# Patient Record
Sex: Female | Born: 1995 | Hispanic: Yes | Marital: Single | Smoking: Never smoker
Health system: Southern US, Community
[De-identification: ages and names within clinical notes are randomized; demographics above are authoritative.]

---

## 2021-02-16 ENCOUNTER — Ambulatory Visit (INDEPENDENT_AMBULATORY_CARE_PROVIDER_SITE_OTHER): Payer: Self-pay | Admitting: Primary Care

## 2021-02-16 ENCOUNTER — Encounter (INDEPENDENT_AMBULATORY_CARE_PROVIDER_SITE_OTHER): Payer: Self-pay | Admitting: Primary Care

## 2021-02-16 ENCOUNTER — Other Ambulatory Visit: Payer: Self-pay

## 2021-02-16 VITALS — BP 117/75 | HR 69 | Temp 97.5°F | Ht 62.0 in | Wt 220.0 lb

## 2021-02-16 DIAGNOSIS — R051 Acute cough: Secondary | ICD-10-CM

## 2021-02-16 DIAGNOSIS — Z131 Encounter for screening for diabetes mellitus: Secondary | ICD-10-CM

## 2021-02-16 DIAGNOSIS — Z7689 Persons encountering health services in other specified circumstances: Secondary | ICD-10-CM

## 2021-02-16 DIAGNOSIS — R7303 Prediabetes: Secondary | ICD-10-CM

## 2021-02-16 DIAGNOSIS — Z6841 Body Mass Index (BMI) 40.0 and over, adult: Secondary | ICD-10-CM

## 2021-02-16 LAB — POCT GLYCOSYLATED HEMOGLOBIN (HGB A1C): Hemoglobin A1C: 5.7 % — AB (ref 4.0–5.6)

## 2021-02-16 NOTE — Patient Instructions (Addendum)
Gripe en los adultos Influenza, Adult A la gripe tambin se la conoce como influenza. Es una [employer], la nariz y la garganta (vas respiratorias). Se transmite fcilmente de persona a persona (es contagiosa). La gripe causa sntomas que son [employer] de un resfro, junto con fiebre alta y dolores corporales. Cules son las causas? La causa de esta afeccin es el virus de la influenza. Puede contraer el virus de las siguientes maneras: Al inhalar gotitas que quedan en el aire despus de que una persona infectada con gripe tosi o estornud. Al tocar algo que est contaminado con el virus y [employer] mano a la boca, la nariz o los ojos. Qu incrementa el riesgo? Hay ciertas cosas que lo pueden hacer ms propenso a Nurse, adult. Estas incluyen lo siguiente: No lavarse las manos con frecuencia. Tener contacto cercano con [employer] durante la temporada de resfro y gripe. Tocarse la boca, los ojos o la nariz sin antes lavarse las manos. No recibir la [employer] INC. Puede correr un mayor riesgo de [place] gripe, y [place] graves, como una infeccin pulmonar (neumona), si usted: Es mayor de 50 aos de edad. Est embarazada. Tiene debilitado el sistema que combate las defensas (sistema inmunitario) debido a una enfermedad o a que toma determinados medicamentos. Tiene una afeccin a largo plazo (crnica), como las siguientes: Enfermedad cardaca, renal o pulmonar. Diabetes. Asma. Tiene un trastorno heptico. Tiene mucho sobrepeso (obesidad [place]). Tiene anemia. Cules son los signos o sntomas? Los sntomas normalmente comienzan de repente y Sonda Primes [address] y [address]. Pueden incluir los siguientes: Cristy Hilts y Danielson. Dolores de [place], dolores en el cuerpo o dolores musculares. Dolor de Investment banker, operational. Tos. Secrecin o congestin nasal. Molestias en el pecho. No querer comer tanto como lo hace normalmente. Sensacin de debilidad o  cansancio. Mareos. Malestar estomacal o vmitos. Cmo se trata? Si la gripe se detecta de forma temprana, puede recibir tratamiento con medicamentos antivirales. Esto puede ayudar a reducir la gravedad y la duracin de la enfermedad. Se los administrarn por boca o a travs de un tubo (catter) intravenoso. Cuidarse en su hogar puede ayudar a que mejoren los sntomas. El mdico puede recomendarle lo siguiente: Tomar medicamentos de Radio broadcast assistant. Beber abundante lquido. La gripe suele desaparecer sola. Si tiene sntomas muy graves u otros problemas, puede recibir tratamiento en un hospital. Siga estas instrucciones en su casa:   Actividad Descanse todo lo que sea necesario. Duerma lo suficiente. Foy [place] en su casa y no concurra al [place] o a la escuela, como se lo haya indicado el mdico. No salga de su casa hasta que no haya tenido fiebre por 24 horas sin tomar medicamentos. Salga de su casa solamente para ir al [employer]. Comida y bebida Wyatt Haste SRO (solucin de rehidratacin oral). Es Ardelia Mems bebida que se vende en farmacias y tiendas. Beba suficiente lquido como para Theatre manager la orina de color amarillo plido. En la medida en que pueda, beba lquidos transparentes en pequeas cantidades. Los lquidos transparentes son, por ejemplo: Grayce Sessions. Trocitos de hielo. Jugo de frutas mezclado con agua. Bebidas deportivas de bajas caloras. Coma alimentos suaves que sean fciles de digerir. En la medida que pueda, consuma pequeas cantidades. Estos alimentos incluyen: Bananas. Pur de [employer]. Arroz. Carnes magras. Tostadas. Galletas. No coma ni beba lo siguiente: Lquidos con alto contenido de azcar o cafena. Alcohol. Alimentos condimentados o con alto contenido de [place]. Indicaciones generales Use los medicamentos de venta libre y los recetados solamente  como se lo haya indicado el médico. °Use un humidificador de aire frío para que el aire de su casa esté más húmedo. Esto puede facilitar  la respiración. °Cuando utilice un humidificador de vapor frío, límpielo a diario. Vacíe el agua y cámbiela por agua limpia. °Al toser o estornudar, cúbrase la boca y la nariz. °Lávese las manos frecuentemente con agua y jabón y durante al menos 20 segundos. Esto también es importante después de toser o estornudar. Si no dispone de agua y jabón, use desinfectante para manos con alcohol. °Cumpla con todas las visitas de seguimiento. °¿Cómo se previene? ° °Colóquese la vacuna antigripal todos los años. Puede colocarse la vacuna contra la gripe a fines de verano, en otoño o en invierno. Pregúntele al médico cuándo debe aplicarse la vacuna contra la gripe. °Evite el contacto con personas que estén enfermas durante el otoño y el invierno. Es la temporada del resfrío y la gripe. °Comuníquese con un médico si: °Tiene síntomas nuevos. °Tiene los siguientes síntomas: °Dolor de pecho. °Materia fecal líquida (diarrea). °Fiebre. °La tos empeora. °Empieza a tener más mucosidad. °Tiene malestar estomacal. °Vomita. °Solicite ayuda de inmediato si: °Le falta el aire. °Tiene dificultad para respirar. °La piel o las uñas se ponen de un color azulado. °Presenta dolor muy intenso o rigidez en el cuello. °Tiene dolor de cabeza repentino. °Le duele la cara o el oído de forma repentina. °No puede comer ni beber sin vomitar. °Estos síntomas pueden representar un problema grave que constituye una emergencia. Solicite atención médica de inmediato. Comuníquese con el servicio de emergencias de su localidad (911 en los [place]). °No espere a ver si los síntomas desaparecen. °No conduzca por sus propios medios hasta el hospital. °Resumen °A la gripe también se la conoce como “influenza”. Es una infección en los pulmones, la nariz y la garganta. Se transmite fácilmente de una persona a otra. °Use los medicamentos de venta libre y los recetados solamente como se lo haya indicado el médico. °Aplicarse la vacuna contra la gripe todos los  años es la mejor manera de no contagiarse la gripe. °Esta información no tiene como fin reemplazar el consejo del médico. Asegúrese de hacerle al médico cualquier pregunta que tenga. °Document Revised: 11/26/2019 Document Reviewed: 11/26/2019 °Elsevier Patient Education © 2022 Elsevier Inc. °Vacuna Tdap (tétanos, difteria y tos ferina): lo que debe saber °Tdap (Tetanus, Diphtheria, Pertussis) Vaccine: What You Need to Know °1. ¿Por qué vacunarse? °La vacuna Tdap puede prevenir el tétanos, la difteria y la tos ferina. °La difteria y la tos ferina se contagian de persona a persona. El tétanos ingresa al organismo a través de cortes o heridas. °El TÉTANOS (T) provoca rigidez dolorosa en los músculos. El tétanos puede causar graves problemas de salud, como no poder abrir la boca, tener dificultad para tragar y respirar, o la muerte. °La DIFTERIA (D) puede causar dificultad para respirar, insuficiencia cardíaca, parálisis o muerte. °La TOS FERINA (aP), también conocida como “tos convulsa”, puede causar tos violenta e incontrolable lo que hace difícil respirar, comer o beber. La tos ferina puede ser muy grave, especialmente en los bebés y en los niños pequeños, y causar neumonía, convulsiones, daño cerebral o la muerte. En adolescentes y adultos, puede causar pérdida de peso, pérdida del control de la vejiga, desmayos y fracturas de costillas al toser de manera intensa. °2. Vacuna Tdap °La vacuna Tdap es solo para niños de 7 años en adelante, adolescentes y adultos.  °Los adolescentes deben recibir una dosis única de la vacuna   Tdap, preferentemente a los 11 o 12 años. °Las personas embarazadas deben recibir una dosis de la vacuna Tdap durante cada embarazo, preferiblemente durante la primera parte del tercer trimestre, para ayudar a proteger al recién nacido de la tos ferina. Los bebés tienen mayor riesgo de sufrir complicaciones graves y potencialmente mortales debido a la tos ferina. °Los adultos que nunca recibieron la  vacuna Tdap deben recibir una dosis. °Además, los adultos deben recibir una dosis de refuerzo de Tdap o Td (una vacuna diferente que protege contra el tétanos y la difteria, pero no contra la tos ferina) cada 10 años, o después de 5 años en caso de herida o quemadura grave o sucia. °La vacuna Tdap puede ser administrada al mismo tiempo que otras vacunas. °3. Hable con el médico °Comuníquese con la persona que le coloca las vacunas si la persona que la recibe: °Ha tenido una reacción alérgica después de una dosis anterior de cualquier vacuna contra el tétanos, la difteria o la tos ferina, o cualquier alergia grave, potencialmente mortal °Ha tenido un coma, disminución del nivel de la conciencia o convulsiones prolongadas dentro de los 7 días posteriores a una dosis anterior de cualquier vacuna contra la tos ferina (DTP, DTaP o Tdap) °Tiene convulsiones u otro problema del sistema nervioso °Alguna vez tuvo síndrome de Guillain-Barré (también llamado “SGB”) °Ha tenido dolor intenso o hinchazón después de una dosis anterior de cualquier vacuna contra el tétanos o la difteria °En algunos casos, es posible que el médico decida posponer la aplicación de la vacuna Tdap para una visita en el futuro. °Las personas que sufren trastornos menores, como un resfrío, pueden vacunarse. Las personas que tienen enfermedades moderadas o graves generalmente deben esperar hasta recuperarse para poder recibir la vacuna Tdap.  °Su médico puede darle más información. °4. Riesgos de una reacción a la vacuna °Después de recibir la vacuna Tdap a veces se puede tener dolor, enrojecimiento o hinchazón en el lugar donde se aplicó la inyección, fiebre leve, dolor de cabeza, sensación de cansancio y náuseas, vómitos, diarrea o dolor de estómago. °Las personas a veces se desmayan después de procedimientos médicos, incluida la vacunación. Informe al médico si se siente mareado, tiene cambios en la visión o zumbidos en los oídos.  °Al igual que con  cualquier medicamento, existe una probabilidad muy remota de que una vacuna cause una reacción alérgica grave, otra lesión grave o la muerte. °5. ¿Qué pasa si se presenta un problema grave? °Podría producirse una reacción alérgica después de que la persona vacunada abandone la clínica. Si observa signos de una reacción alérgica grave (ronchas, hinchazón de la cara y la garganta, dificultad para respirar, latidos cardíacos acelerados, mareos o debilidad), llame al 9-1-1 y lleve a la persona al hospital más cercano. °Si se presentan otros signos que le preocupan, comuníquese con su médico.  °Las reacciones adversas deben informarse al Sistema de Informe de Eventos Adversos de Vacunas (Vaccine Adverse Event Reporting System, VAERS). Por lo general, el médico presenta este informe o puede hacerlo usted mismo. Visite el sitio web del VAERS en www.vaers.hhs.gov o llame al 1-800-822-7967. El VAERS es solo para informar reacciones, y los miembros de su personal no proporcionan asesoramiento médico. °6. Programa Nacional de Compensación de Daños por Vacunas °El Programa Nacional de Compensación de Daños por Vacunas (National Vaccine Injury Compensation Program, VICP) es un programa federal que fue creado para compensar a las personas que puedan haber sufrido daños al recibir ciertas vacunas. Las reclamaciones relativas a presuntas lesiones o   muerte debidas a la vacunacin tienen un lmite de tiempo para su presentacin, que puede ser de tan solo [employer]. Visite el sitio web del VICP en GoldCloset.com.ee o llame al [phone]) 749-7523 para obtener ms informacin acerca del programa y de cmo presentar un reclamo. 7. Cmo puedo obtener ms informacin? Pregntele a su mdico. Comunquese con el servicio de salud de su localidad o su estado. Visite el sitio web de Research scientist (life sciences), Surveyor, minerals) (Administracin de Alimentos y Chief Strategy Officer) para ver los prospectos de las vacunas e informacin adicional en  TraderRating.uy. Comunquese con Garment/textile technologist for [employer], CDC (Centros para el Control y la Prevencin de [place]): Llame al [phone] (1-800-CDC-INFO) o Visite el sitio web de [employer] en http://hunter.com/. Declaracin de informacin de la vacuna Tdap (ttanos, difteria y Elmer Picker) (09/18/2019) Esta informacin no tiene Marine scientist el consejo del mdico. Asegrese de hacerle al mdico cualquier pregunta que tenga. Document Revised: 11/17/2019 Document Reviewed: 11/02/2019 Elsevier Patient Education  2022 [place] la diabetes mellitus tipo 2 Preventing Type 2 Diabetes Mellitus La diabetes tipo 2, tambin llamada diabetes mellitus tipo 2, es una enfermedad a Barrister's clerk (crnica) que afecta los niveles de azcar (glucosa) en la [place]. Normalmente, una hormona denominada insulina permite el ingreso de la glucosa en las clulas del cuerpo. Las clulas usan la glucosa para Dealer. Las personas con diabetes tipo 2 pueden tener uno o ambos problemas descriptos a continuacin: El pncreas no produce la cantidad suficiente de insulina. Las clulas del cuerpo no responden de [place] a la insulina que el organismo produce (resistencia a la insulina). La resistencia a la insulina o la falta de insulina hacen que el exceso de glucosa se acumule en la sangre, en lugar de ir a las clulas. Como resultado, aumenta la glucemia (hiperglucemia). Esto puede causar muchas complicaciones. Al tener sobrepeso o ser obeso y tener un estilo de vida inactivo (sedentario) puede aumentar su riesgo de padecer diabetes. La diabetes tipo 2 se puede retrasar o prevenir al realizar determinados cambios en la alimentacin y el estilo de vida. Cmo puede afectarme esta enfermedad? Si no toma medidas para prevenir la diabetes, los niveles de glucemia pueden continuar aumentando con el [place]. Un exceso de glucosa en la  sangre durante mucho tiempo puede daar los vasos sanguneos, el corazn, los riones, los nervios y los ojos. La diabetes tipo 2 puede ocasionar problemas de salud crnicos y complicaciones, tales como: Enfermedad cardaca. Accidente cerebrovascular. Ceguera. Enfermedad renal. Depresin. Mala circulacin en los pies y las piernas. En los casos graves, puede ser necesario extirpar quirrgicamente (amputar) un pie o una pierna. [place]? Puede ser ms probable que desarrolle diabetes tipo 2 si: Tiene casos de diabetes tipo 2 en su familia. Tiene sobrepeso u obesidad. Tiene un estilo de vida sedentario. Tiene resistencia a la insulina o antecedentes de prediabetes. Tiene antecedentes de diabetes relacionada con el embarazo (gestacional) o sndrome del ovario poliqustico (SOP). Qu medidas de prevencin puedo tomar? Puede ser difcil reconocer los signos de la diabetes tipo 2. Tomar medidas para prevenir la enfermedad antes que desarrollar sntomas es la mejor manera de evitar posibles daos en el cuerpo. Realizar ciertos Catering manager alimentacin y el estilo de vida puede prevenir o Nurse, children's enfermedad y los problemas de salud relacionados. Nutricin  Consuma comidas y refrigerios saludables con regularidad. No omita comidas. Las frutas o un puado de frutos secos son Ardelia Mems colacin saludable  entre las comidas. Beba agua [place]. Evite los lquidos que contengan azcar agregada como las gaseosas o el t [place]. Beba suficiente lquido como para Theatre manager la orina de color amarillo plido. Siga las instrucciones del mdico respecto de las restricciones en las comidas o las bebidas. Limite la cantidad de alimentos que come: Controle la cantidad que come por vez (tamao de la porcin). Consulte las etiquetas de los alimentos para Civil engineer, contracting el tamao de la porcin correspondiente. Use una balanza de cocina para pesar las cantidades de alimentos. Saltee o hierva al  vapor los Building surveyor de frerlos. Cocine con agua o caldo en lugar de aceites o manteca. Limite las grasas saturadas y la sal (sodio) en su alimentacin. No consuma ms de 1 cucharadita (2,400 mg) de [employer]. Si tiene enfermedad cardaca o presin arterial alta, consuma menos de  a  cucharadita (1,500 mg) de [employer]. Estilo de vida  Baje de peso si es necesario, tal como se lo hayan indicado. El mdico puede determinar cunto peso tiene que perder y Hotel manager a que adelgace de Geographical information systems officer segura. Si tiene sobrepeso o es obeso, es posible que le digan que pierda al menos entre el 5 y el 7 % de su peso corporal. Encrguese de la presin arterial, el colesterol y el estrs. Su mdico lo ayudar a Leisure centre manager tratamiento para usted. No consuma ningn producto que contenga nicotina o tabaco. Estos productos incluyen cigarrillos, tabaco para Higher education careers adviser y aparatos de vapeo, como los Psychologist, sport and exercise. Si necesita ayuda para dejar de fumar, consulte al [employer]. Actividad  Practique actividad fsica que le acelere el ritmo cardaco y lo haga sudar (intensidad [place]). Haga [employer], al menos, 30 minutos, como mnimo 5 das por semana, o segn lo indicado por el mdico. Pregntele al mdico qu actividades son seguras para usted. Una combinacin de actividades puede ser la mejor opcin como, por ejemplo, caminar, Psychologist, prison and probation services natacin, andar en bicicleta y hacer entrenamiento de fuerza. Trate de agregar actividad fsica a su jornada. Por ejemplo: Estacione el automvil en lugares ms alejados de lo habitual para tener que caminar ms. Vaya a caminar durante su hora de almuerzo. Utilice las Clinical cytogeneticist de ascensores o escaleras mecnicas. Camine o vaya en bicicleta al Mat [place]ne en lugar de conducir. Consumo de alcohol Si bebe alcohol: Limite la cantidad que bebe a lo siguiente: De 0 a 1 medida por da para las mujeres que no estn embarazadas. De 0 a 2 medidas por da para  los hombres. Sepa cunta cantidad de alcohol hay en las bebidas que toma. En los [place], una medida equivale a una botella de cerveza de 12 oz (355 ml), un vaso de vino de 5 oz (148 ml) o un vaso de una bebida alcohlica de alta graduacin de 1 oz (44 ml). Informacin general Hable con el mdico acerca de todos los factores de riesgo y de cmo puede reducir el riesgo de desarrollar diabetes. Contrlese la glucemia de manera regular, segn lo indicado por su mdico. Somtase a pruebas de deteccin segn lo indicado por su mdico. Puede realizrselas con regularidad, especialmente si tiene ciertos factores de riesgo de padecer diabetes tipo 2. Programe una cita con un nutricionista matriculado. Este especialista en alimentacin y nutricin puede ayudarlo a crear un plan de alimentacin saludable y a comprender los tamaos de las porciones y las etiquetas de los alimentos. Dnde buscar apoyo Pida a su mdico que le recomiende un nutricionista matriculado, un  especialista en atencin y educacin sobre la diabetes certificado o un programa para Sports coach de [place]. Busque grupos de apoyo para bajar de peso a nivel local o en lnea. Asista a un gimnasio, club de acondicionamiento fsico o nase a un grupo que realice actividad fsica al aire libre como un club de caminata. Dnde buscar ms informacin Para obtener ayuda y Fish farm manager, as como ms informacin sobre la diabetes y la prevencin de la diabetes, visite: American Diabetes Association (ADA) (Asociacin Estadounidense de la Diabetes): www.diabetes.Unisys Corporation of Diabetes and Digestive and Kidney Diseases ([place] Diabetes y [place] y [place]): DesMoinesFuneral.dk Para obtener ms informacin sobre alimentacin saludable, visite: U.S. Deparment of Agriculture Scientist, research (physical sciences)) (Departamento de Agricultura de los EE. UU.): FormerBoss.no Office of Disease Prevention and Health Promotion ([place]) (Oficina  de Prevencin de [place] y Promocin de la Salud): [place]ud[place]aleEstates.be Resumen Puede prevenir la diabetes tipo 2 o Lexicographer su aparicin al comer alimentos saludables, bajar de peso si es necesario y aumentar su actividad fsica. Hable con el mdico acerca de sus factores de riesgo para desarrollar diabetes tipo 2 y de cmo puede reducir dicho riesgo. Puede ser difcil reconocer los signos de la diabetes tipo 2. La mejor manera de evitar posibles daos en el cuerpo es tomar medidas para prevenir la enfermedad antes que usted desarrolle los sntomas. Somtase a pruebas de Programme researcher, broadcasting/film/video segn lo indicado por su mdico. Esta informacin no tiene Marine scientist el consejo del mdico. Asegrese de hacerle al mdico cualquier pregunta que tenga. Document Revised: 05/19/2020 Document Reviewed: 05/19/2020 Elsevier Patient Education  2022 [place] de caloras para bajar de peso Calorie Counting for [employer] Loss Las caloras son unidades de Teacher, early years/pre. El cuerpo necesita una cierta cantidad de caloras de los alimentos para que lo ayuden a funcionar durante todo Games developer. Cuando se comen o beben ms caloras de las que el cuerpo [place], este acumula las caloras adicionales mayormente como grasa. Cuando se comen o beben menos caloras de las que el cuerpo [place], este quema grasa para obtener la energa que necesita. El recuento de caloras es el registro de la cantidad de caloras que se comen y English as a second language teacher. El recuento de caloras puede ser de ayuda si necesita perder peso. Si come menos caloras de las que el cuerpo necesita, debera bajar de [place]. Pregntele al mdico cul es un peso sano para usted. Para que el recuento de caloras funcione, usted tendr que ingerir la cantidad de caloras adecuadas cada da, para bajar una cantidad de peso saludable por semana. Un nutricionista puede ayudar a determinar la cantidad de caloras que usted necesita por da y sugerirle formas de Science writer su  objetivo calrico. Ardelia Mems cantidad de peso saludable para bajar cada semana suele ser entre 1 y 2 libras (0.5 a 0.9 kg). Esto habitualmente significa que su ingesta diaria de caloras se debera reducir en unas 500 a 750 caloras. Ingerir de 1200 a 1500 caloras por Administrator, Civil Service a la [employer] de las mujeres a Sports coach de [place]. Ingerir de 1500 a 1800 caloras por Administrator, Civil Service a la [employer] de los hombres a Sports coach de [place]. Qu debo saber acerca del recuento de caloras? Trabaje con el mdico o el nutricionista para determinar cuntas caloras debe recibir Armed forces operational officer. A fin de alcanzar su objetivo diario de caloras, tendr que: Averiguar cuntas caloras hay en cada alimento que le Therapist, occupational. Intente hacerlo antes de comer. Decidir la cantidad que puede comer del alimento.  Llevar un registro de los alimentos. Para esto, anote lo que comi y cuntas caloras tena. Para perder peso con xito, es importante equilibrar el recuento de caloras con un estilo de vida saludable que incluya actividad fsica de forma regular. Dnde encuentro informacin sobre las caloras? Es posible Animator cantidad de caloras que contiene un alimento en la etiqueta de informacin nutricional. Si un alimento no tiene una etiqueta de informacin nutricional, intente buscar las caloras en Internet o pida ayuda al nutricionista. Recuerde que las caloras se calculan por porcin. Si opta por comer ms de una porcin de un alimento, tendr [employer] las caloras de una porcin por la cantidad de porciones que planea comer. Por ejemplo, la etiqueta de un envase de pan puede decir que el tamao de una porcin es 1 rodaja, y que una porcin tiene 90 caloras. Si come 1 rodaja, habr comido 90 caloras. Si come 2 rodajas, habr comido 180 caloras. Cmo llevo un registro de comidas? Despus de cada vez que coma, anote lo siguiente en el registro de alimentos lo antes posible: Lo que comi. Asegrese de [employer], las salsas y otros extras [employer]ck & [employer]. La cantidad que comi. Esto se puede medir en tazas, onzas o cantidad de alimentos. Cuntas caloras haba en cada alimento y en cada bebida. La cantidad total de caloras en la comida que tom. Tenga a Materials engineer de alimentos, por ejemplo, en un anotador de bolsillo o utilice una aplicacin o sitio web en el telfono mvil. Algunos programas calcularn las caloras por usted y Automotive engineer la cantidad de caloras que le quedan para llegar al objetivo diario. Cules son algunos consejos para controlar las porciones? Sepa cuntas caloras hay en una porcin. Esto lo ayudar a saber cuntas porciones de un alimento determinado puede comer. Use una taza medidora para medir los tamaos de las porciones. Tambin Secondary school teacher las porciones en una balanza de cocina. Con el tiempo, podr hacer un clculo estimativo de los tamaos de las porciones de algunos alimentos. Dedique tiempo a poner porciones de diferentes alimentos en sus platos, tazones y tazas predilectos, a fin de saber cmo se ve una porcin. Intente no comer directamente de un envase de alimentos, por ejemplo, de una bolsa o una caja. Comer directamente del envase dificulta ver cunto est comiendo y puede conducir a Photographer. Ponga la cantidad [employer] gustara comer en una taza o un plato, a fin de asegurarse de que est comiendo la porcin correcta. Use platos, vasos y tazones ms pequeos para medir porciones ms pequeas y Product/process development scientist no comer en exceso. Intente no realizar varias tareas al [employer]. Por ejemplo, evite mirar televisin o usar la [employer] come. Si es la hora de comer, sintese a Conservation officer, nature y disfrute de Environmental education officer. Esto lo ayudar a Marine scientist cundo est satisfecho. Tambin le permitir estar ms consciente de qu come y cunto come. Consejos para seguir Catering manager Al leer las etiquetas de los alimentos Controle el recuento de caloras en  comparacin con el tamao de la porcin. El tamao de la porcin puede ser ms pequeo de lo que suele comer. Verifique la fuente de las caloras. Intente elegir alimentos ricos en protenas, fibras y vitaminas, y bajos en grasas saturadas, grasas trans y [place]. Al ir de compras Lea las etiquetas nutricionales cuando compre. Esto lo ayudar a tomar decisiones saludables sobre qu alimentos comprar. Preste atencin a las etiquetas nutricionales de alimentos bajos en  grasas o sin grasas. Estos alimentos a veces tienen la misma cantidad de caloras o ms caloras que las versiones ricas en grasas. Con frecuencia, tambin tienen agregados de azcar, almidn o sal, para darles el sabor que fue eliminado con las grasas. Haga una lista de compras con los alimentos que tienen un menor contenido de caloras y Freight forwarder. Al cocinar Intente cocinar sus alimentos preferidos de una manera ms saludable. Por ejemplo, pruebe hornear en vez de frer. Utilice productos lcteos descremados. Planificacin de las comidas Utilice ms frutas y verduras. La mitad de su plato debe ser de frutas y verduras. Incluya protenas magras, como pollo, pavo y [place]. Estilo de [employer], trate de hacer una de las siguientes cosas: 150 minutos de ejercicio moderado, como caminar. 75 minutos de ejercicio enrgico, como correr. Informacin general Sepa cuntas caloras tienen los alimentos que come con ms frecuencia. Esto le ayudar a contar las caloras ms rpidamente. Encuentre un mtodo para controlar las caloras que funcione para usted. Sea creativo. Pruebe aplicaciones o programas distintos, si llevar un registro de las caloras no funciona para usted. Qu alimentos debo consumir?  Consuma alimentos nutritivos. Es mejor comer un alimento nutritivo, de alto contenido calrico, como un aguacate, que uno con pocos nutrientes, como una bolsa de patatas fritas. Use sus caloras en alimentos y bebidas que lo sacien y  no lo dejen con apetito apenas termina de comer. Ejemplos de alimentos que lo sacian son los frutos secos y Engineer, mining de frutos secos, verduras, Advertising account planner y Clinical research associate con alto contenido de Pharmacist, hospital como los cereales integrales. Los alimentos con alto contenido de [place] son aquellos que tienen ms de 5 g de fibra por porcin. Preste atencin a las [employer] Data. Las bebidas de bajas caloras incluyen agua y refrescos sin Location manager. Es posible que los productos que se enumeran ms [place] no constituyan una lista completa de los alimentos y las bebidas que puede tomar. Consulte a un nutricionista para obtener ms informacin. Qu alimentos debo limitar? Limite el consumo de alimentos o bebidas que no sean buenas fuentes de vitaminas, minerales o protenas, o que tengan alto contenido de grasas no saludables. Estos incluyen: Caramelos. Otros dulces. Refrescos, bebidas con caf especiales, alcohol y [place]. Es posible que los productos que se enumeran ms [place] no constituyan una lista completa de los alimentos y las bebidas que Nurse, adult. Consulte a un nutricionista para obtener ms informacin. Cmo puedo hacer el recuento de caloras cuando como afuera? Preste atencin a las porciones. A menudo, las porciones son mucho ms grandes al comer afuera. Pruebe con estos consejos para mantener las porciones ms pequeas: Considere la posibilidad de compartir una comida en lugar de tomarla toda usted solo. Si pide su propia comida, coma solo la mitad. Antes de empezar a comer, pida un recipiente y ponga la mitad de la comida en l. Cuando sea posible, considere la posibilidad de pedir porciones ms pequeas del men en lugar de porciones completas. Preste atencin a Marine scientist de alimentos y bebidas. Saber la forma en que se cocinan los alimentos y lo que incluye la comida puede ayudarlo a ingerir menos caloras. Si se detallan las caloras en el men, elija las opciones que contengan la  menor cantidad. Elija platos que incluyan verduras, frutas, cereales integrales, productos lcteos con bajo contenido de grasa y Advertising account planner. Opte por los alimentos hervidos, asados, cocidos a la parrilla o al vapor. Evite los alimentos a los que se les ponga [place], que  estn empanados o fritos, o que se sirvan con salsa a base de crema. Generalmente, los alimentos que se etiquetan como crujientes estn fritos, a menos que se indique lo contrario. Elija el agua, la [place], [place] t helado sin azcar u otras bebidas que no contengan azcares agregados. Si desea una bebida alcohlica, escoja una opcin con menos caloras, como una copa de vino o una cerveza ligera. Ordene los [employer], las salsas y los jarabes aparte. Estos son, con frecuencia, de alto contenido en caloras, por lo que debe limitar la cantidad que ingiere. Si desea Katherine Mantle, elija una de hortalizas y pida carnes a la parrilla. Evite las guarniciones adicionales como el tocino, el queso o los alimentos fritos. Ordene el aderezo aparte o pida aceite de [place] y vinagre o limn para Haematologist. Haga un clculo estimativo de la cantidad de porciones que le sirven. Conocer el tamao de las porciones lo ayudar a Personnel officer atento a la cantidad de comida que come [employer]. Dnde buscar ms informacin Centers for Disease Control and Prevention (Centros para el Control y [place]): http://www.wolf.info/ U.S. Department of Agriculture (Departamento de Agricultura de los EE. UU.): http://www.wilson-mendoza.org/ Resumen El recuento de caloras es el registro de la cantidad de caloras que se comen y English as a second language teacher. Si come menos caloras de las que el cuerpo necesita, debera bajar de [place]. Una cantidad de peso saludable para bajar por semana suele ser entre 1 y 2 libras (0.5 a 0.9 kg). Esto significa, con frecuencia, reducir su ingesta diaria de caloras unas 500 a 750 caloras. Es posible Animator cantidad de caloras que  contiene un alimento en la etiqueta de informacin nutricional. Si un alimento no tiene una etiqueta de informacin nutricional, intente buscar las caloras en Internet o pida ayuda al nutricionista. Use platos, vasos y tazones ms pequeos para medir porciones ms pequeas y Product/process development scientist no comer en exceso. Use sus caloras en alimentos y bebidas que lo sacien y no lo dejen con apetito poco tiempo despus de haber comido. Esta informacin no tiene Marine scientist el consejo del mdico. Asegrese de hacerle al mdico cualquier pregunta que tenga. Document Revised: 05/25/2019 Document Reviewed: 05/25/2019 Elsevier Patient Education  [place] es este medicamento? La GUAIFENESINA trata la tos y la congestin del pecho. Acta diluyendo y aflojando la mucosidad, y as Youth worker que se expulse de la cabeza, la garganta y los pulmones. Pertenece a un grupo de medicamentos llamados expectorantes. Este medicamento puede ser utilizado para otros usos; si tiene alguna pregunta consulte con su proveedor de atencin mdica o con su farmacutico. MARCAS COMUNES: Altarussin, Altorant, Chest Congestion Relief, Cough, Diabetic Tussin, Diabetic Tussin EX, Diabetic Tussin Mucus Relief, ElixSure EX, Ganidin NR, GERI-TUSSIN, Guiatuss, Iophen-NR, Miltuss EX, Mucinex Children's, Mucinex Fast-Max Chest Congestion, Mucus + Chest Congestion, Mucus Relief Children's, Naldecon, Organidin NR, Q-Tussin, Robafen, Robafen Congestion, Robitussin, Robitussin Mucus + Chest Congestion, Scot-Tussin Expectorant, Siltussin DAS, Siltussin Diabetic DAS-Na, Siltussin SA, TUSNEL-EX Qu le debo informar a mi profesional de la salud antes de tomar este medicamento? Necesitan saber si usted presenta alguno de los siguientes problemas o situaciones: Diabetes Fiebre Enfermedad renal Una reaccin alrgica o inusual a la guaifenesina, a otros medicamentos, alimentos, colorantes o conservantes Si est embarazada o  buscando quedar embarazada Si est amamantando a un beb Cmo debo [employer]? Tome este medicamento por va oral. Siga las instrucciones de la etiqueta del [place]. Utilice una cuchara o un recipiente especialmente  marcados para medir su dosis. Las cucharas domsticas no son exactas. Use su medicamento a intervalos regulares. No lo use con una frecuencia mayor a la indicada. Hable con su equipo de atencin sobre el uso de este medicamento en nios. Puede requerir atencin especial. Sobredosis: Pngase en contacto inmediatamente con un centro toxicolgico o una sala de urgencia si usted cree que haya tomado demasiado medicamento. ATENCIN: [employer] es solo para usted. No comparta este medicamento con nadie. Qu sucede si me olvido de una dosis? Si olvida una dosis, tmela lo antes posible. Si es casi la hora de la prxima dosis, tome slo esa dosis. No tome dosis adicionales o dobles. Qu puede interactuar con este medicamento? No se anticipan interacciones. Puede ser que esta lista no menciona todas las posibles interacciones. Informe a su profesional de [employer]	[place] de [employer] Corporation productos a base de hierbas, medicamentos de [place] o suplementos nutritivos que est tomando. Si usted fuma, consume bebidas alcohlicas o si utiliza drogas ilegales, indqueselo tambin a su profesional de [employer]	[place]. Algunas sustancias pueden interactuar con su medicamento. A qu debo estar atento al usar Coca[employer]Cola? No trate la tos durante ms de 1 semana sin consultar con su equipo de atencin. Si tambin tiene fiebre alta, una erupcin cutnea, dolor de cabeza prolongado o dolor de garganta, consulte a su equipo de atencin. Para obtener los Levi Strauss, beba de 6 a 8 vasos de agua todos los das mientras [place] este medicamento. Qu efectos secundarios puedo tener al [employer] este medicamento? Efectos secundarios que debe informar a su equipo de atencin tan pronto como sea  posible: Reacciones alrgicas: erupcin cutnea, comezn/picazn, urticaria, hinchazn de la cara, los labios, la lengua o la garganta Efectos secundarios que generalmente no requieren atencin mdica (debe informarlos a su equipo de atencin si persisten o si son molestos): Acupuncturist Dolor de cabeza Puede ser que esta lista no menciona todos los posibles efectos secundarios. Comunquese a su mdico por asesoramiento mdico Humana Inc. Usted puede informar los efectos secundarios a la FDA por telfono al 1-800-FDA-1088. Dnde debo guardar mi medicina? Mantenga fuera del alcance de los nios. Guarde a [employer], entre 20 y 26 grados Celsius (24 y 74 grados Fahrenheit). No congele. [place]. Deseche todo el medicamento que no haya utilizado despus de la fecha de vencimiento. ATENCIN: Este folleto es un resumen. Puede ser que no cubra toda la posible informacin. Si usted tiene preguntas acerca de esta medicina, consulte con su mdico, su farmacutico o su profesional de Technical sales engineer.  2022 Elsevier/Gold Standard (2020-08-29 00:00:00)

## 2021-02-16 NOTE — Progress Notes (Signed)
New Patient Office Visit  Subjective:  Patient ID: Brenda Calderon, female    DOB: 01/05/1997  Age: 26 y.o. MRN: [id number]  CC:  Chief Complaint  Patient presents with   New Patient (Initial Visit)    HPI Ms. Brenda Calderon 26 year old Hispanic morbid obese female (interpreter Alecia Lemming [phone]) presents for establishment of care. Patient has given her mother permission to be present at her appointment Brenda Calderon)  She voices concerns of a cough for a month and taking Vick's for cold relief- with no relief .Sputum/ phelm denies fever, chills, or weight loss. She also, has been very thirsty for about a month she can drink a gallon water and still be thirsty .   No past medical history on file.  No family history on file.  Social History   Socioeconomic History   Marital status: Single    Spouse name: Not on file   Number of children: Not on file   Years of education: Not on file   Highest education level: Not on file  Occupational History   Not on file  Tobacco Use   Smoking status: Never   Smokeless tobacco: Never  Substance and Sexual Activity   Alcohol use: Not on file   Drug use: Never   Sexual activity: Never    Partners: Male  Other Topics Concern   Not on file  Social History Narrative   Not on file   Social Determinants of Health   Financial Resource Strain: Not on file  Food Insecurity: Not on file  Transportation Needs: Not on file  Physical Activity: Not on file  Stress: Not on file  Social Connections: Not on file  Intimate Partner Violence: Not on file    ROS Review of Systems  Respiratory:  Positive for cough and shortness of breath.   All other systems reviewed and are negative.  Objective:   Today's Vitals: BP 117/75 (BP Location: Right Arm, Patient Position: Sitting, Cuff Size: Large)    Pulse 69    Temp (!) 97.5 F (36.4 C) (Temporal)    Ht 5\' 2"  (1.575 m)    Wt 220 lb (99.8 kg)    LMP 01/10/2021 (Exact Date)    SpO2 97%    BMI 40.24  kg/m   Physical Exam Physical exam: General: Vital signs reviewed.  Patient is well-developed and well-nourished, morbid obese female in no acute distress and cooperative with exam. Head: Normocephalic and atraumatic. Eyes: EOMI, conjunctivae normal, no scleral icterus. Neck: Supple, trachea midline, normal ROM, no JVD, masses, thyromegaly, or carotid bruit present. Cardiovascular: RRR, S1 normal, S2 normal, no murmurs, gallops, or rubs. Pulmonary/Chest: Clear to auscultation bilaterally, no wheezes, rales, or rhonchi. Abdominal: Soft, non-tender, non-distended, BS +, no masses, organomegaly, or guarding present. Musculoskeletal: No joint deformities, erythema, or stiffness, ROM full and nontender. Extremities: No lower extremity edema bilaterally,  pulses symmetric and intact bilaterally. No cyanosis or clubbing. Neurological: A&O x3, Strength is normal Skin: Warm, dry and intact. No rashes or erythema. Psychiatric: Normal mood and affect. speech and behavior is normal. Cognition and memory are normal.    Assessment & Plan:   Problem List Items Addressed This Visit   None Visit Diagnoses     Screening for diabetes mellitus    -  Primary   Relevant Orders   HgB A1c       No outpatient encounter medications on file as of 02/16/2021.   No facility-administered encounter medications on file as of 02/16/2021.  Follow-up: No follow-ups on file.   Grayce Sessions, NP

## 2021-02-17 LAB — CMP14+EGFR
ALT: 44 IU/L — ABNORMAL HIGH (ref 0–32)
AST: 37 IU/L (ref 0–40)
Albumin/Globulin Ratio: 1.4 (ref 1.2–2.2)
Albumin: 4.2 g/dL (ref 3.9–5.0)
Alkaline Phosphatase: 96 IU/L (ref 44–121)
BUN/Creatinine Ratio: 24 — ABNORMAL HIGH (ref 9–23)
BUN: 14 mg/dL (ref 6–20)
Bilirubin Total: 0.4 mg/dL (ref 0.0–1.2)
CO2: 23 mmol/L (ref 20–29)
Calcium: 9 mg/dL (ref 8.7–10.2)
Chloride: 105 mmol/L (ref 96–106)
Creatinine, Ser: 0.59 mg/dL (ref 0.57–1.00)
Globulin, Total: 3.1 g/dL (ref 1.5–4.5)
Glucose: 96 mg/dL (ref 70–99)
Potassium: 4.2 mmol/L (ref 3.5–5.2)
Sodium: 142 mmol/L (ref 134–144)
Total Protein: 7.3 g/dL (ref 6.0–8.5)
eGFR: 129 mL/min/{1.73_m2} (ref >59)

## 2021-02-17 LAB — LIPID PANEL
Chol/HDL Ratio: 5.3 ratio — ABNORMAL HIGH (ref 0.0–4.4)
Cholesterol, Total: 186 mg/dL (ref 100–199)
HDL: 35 mg/dL — ABNORMAL LOW (ref >39)
LDL Chol Calc (NIH): 128 mg/dL — ABNORMAL HIGH (ref 0–99)
Triglycerides: 126 mg/dL (ref 0–149)
VLDL Cholesterol Cal: 23 mg/dL (ref 5–40)

## 2021-02-20 ENCOUNTER — Ambulatory Visit: Payer: Self-pay | Attending: Nurse Practitioner

## 2021-02-20 ENCOUNTER — Other Ambulatory Visit: Payer: Self-pay

## 2021-02-24 ENCOUNTER — Telehealth (INDEPENDENT_AMBULATORY_CARE_PROVIDER_SITE_OTHER): Payer: Self-pay

## 2021-02-24 NOTE — Telephone Encounter (Signed)
Call placed to patient with the assistance of pacific interpreter [phone]). Patient is aware of the following results; Labs are normal except cholesterol this can increase your risk of heart attack and stroke.  Discussed at visit improving diet and exercising no medication at this time will monitor.  Healthy lifestyle diet of fruits vegetables fish nuts whole grains and low saturated fat . Foods high in cholesterol or liver, fatty meats,cheese, butter avocados, nuts and seeds, chocolate and fried foods. She verbalized understanding and did not have any questions. Maryjean Morn, CMA

## 2021-02-24 NOTE — Telephone Encounter (Signed)
-----   Message from Grayce Sessions, NP sent at 02/22/2021  5:39 PM EST ----- Labs are normal except cholesterol this can increase your risk of heart attack and stroke.  Discussed at visit improving diet and exercising no medication at this time will monitor.  Healthy lifestyle diet of fruits vegetables fish nuts whole grains and low saturated fat . Foods high in cholesterol or liver, fatty meats,cheese, butter avocados, nuts and seeds, chocolate and fried foods.

## 2021-03-18 ENCOUNTER — Encounter (HOSPITAL_COMMUNITY): Payer: Self-pay | Admitting: Emergency Medicine

## 2021-03-18 ENCOUNTER — Ambulatory Visit (INDEPENDENT_AMBULATORY_CARE_PROVIDER_SITE_OTHER): Payer: Self-pay

## 2021-03-18 ENCOUNTER — Other Ambulatory Visit: Payer: Self-pay

## 2021-03-18 ENCOUNTER — Ambulatory Visit (HOSPITAL_COMMUNITY)
Admission: EM | Admit: 2021-03-18 | Discharge: 2021-03-18 | Disposition: A | Discharge status: Home / Self Care | Payer: Self-pay | Attending: Emergency Medicine | Admitting: Emergency Medicine

## 2021-03-18 DIAGNOSIS — J209 Acute bronchitis, unspecified: Secondary | ICD-10-CM

## 2021-03-18 DIAGNOSIS — R059 Cough, unspecified: Secondary | ICD-10-CM

## 2021-03-18 MED ORDER — PREDNISONE 20 MG PO TABS: 40.0000 mg | ORAL_TABLET | Freq: Every day | ORAL | 0 refills | Status: DC

## 2021-03-18 MED ORDER — BENZONATATE 100 MG PO CAPS: 100.0000 mg | ORAL_CAPSULE | Freq: Three times a day (TID) | ORAL | 0 refills | Status: DC

## 2021-03-18 MED ORDER — AZITHROMYCIN 250 MG PO TABS: 250.0000 mg | ORAL_TABLET | Freq: Every day | ORAL | 0 refills | Status: DC

## 2021-03-18 MED ORDER — PROMETHAZINE-DM 6.25-15 MG/5ML PO SYRP: 5.0000 mL | ORAL_SOLUTION | Freq: Four times a day (QID) | ORAL | 0 refills | Status: DC | PRN

## 2021-03-18 MED ORDER — PROMETHAZINE-DM 6.25-15 MG/5ML PO SYRP
5.0000 mL | ORAL_SOLUTION | Freq: Four times a day (QID) | ORAL | 0 refills | Status: DC | PRN
Start: 2021-03-18 — End: 2021-03-18

## 2021-03-18 NOTE — ED Triage Notes (Signed)
Pt reports a cough x 1 month, nasal drainage and headache.

## 2021-03-18 NOTE — ED Provider Notes (Signed)
[facility]    CSN: [id number] Arrival date & time: 03/18/21  1357      History   Chief Complaint Chief Complaint  Patient presents with   URI    HPI Brenda Calderon is a 26 y.o. female.   Patient presents with nasal congestion, rhinorrhea, productive cough and intermittent wheezing for 2 weeks.  Endorses that symptoms are worsening.  No known sick contacts.  Has attempted use of over-the-counter Robitussin which has been somewhat helpful.  Tolerating food and electrolytes.  No pertinent medical history.  Denies fever, chills, body aches, chest pain or tightness, shortness of breath.   History reviewed. No pertinent past medical history.  There are no problems to display for this patient.   History reviewed. No pertinent surgical history.  OB History   No obstetric history on file.      Home Medications    Prior to Admission medications   Not on File    Family History Family History  Problem Relation Age of Onset   Healthy Mother    Healthy Father     Social History Social History   Tobacco Use   Smoking status: Never   Smokeless tobacco: Never  Substance Use Topics   Drug use: Never     Allergies   Shrimp (diagnostic)   Review of Systems Review of Systems  Constitutional: Negative.   HENT:  Positive for congestion and rhinorrhea. Negative for dental problem, drooling, ear discharge, ear pain, facial swelling, hearing loss, mouth sores, nosebleeds, postnasal drip, sinus pressure, sinus pain, sneezing, sore throat, tinnitus, trouble swallowing and voice change.   Respiratory:  Positive for cough and wheezing. Negative for apnea, choking, chest tightness, shortness of breath and stridor.   Cardiovascular: Negative.   Gastrointestinal: Negative.   Skin: Negative.     Physical Exam Triage Vital Signs ED Triage Vitals  Enc Vitals Group     BP 03/18/21 1453 124/68     Pulse Rate 03/18/21 1453 74     Resp 03/18/21 1453 16      Temp 03/18/21 1453 98.3 F (36.8 C)     Temp Source 03/18/21 1453 Oral     SpO2 03/18/21 1453 98 %     Weight 03/18/21 1451 220 lb 0.3 oz (99.8 kg)     Height 03/18/21 1451 5\' 2"  (1.575 m)     Head Circumference --      Peak Flow --      Pain Score 03/18/21 1450 8     Pain Loc --      Pain Edu? --      Excl. in [place]? --    No data found.  Updated Vital Signs BP 124/68 (BP Location: Left Arm)    Pulse 74    Temp 98.3 F (36.8 C) (Oral)    Resp 16    Ht 5\' 2"  (1.575 m)    Wt 220 lb 0.3 oz (99.8 kg)    SpO2 98%    BMI 40.24 kg/m   Visual Acuity Right Eye Distance:   Left Eye Distance:   Bilateral Distance:    Right Eye Near:   Left Eye Near:    Bilateral Near:     Physical Exam Constitutional:      Appearance: Normal appearance.  HENT:     Head: Normocephalic.     Right Ear: Tympanic membrane, ear canal and external ear normal.     Left Ear: Tympanic membrane, ear canal and external ear normal.  Nose: Congestion and rhinorrhea present.     Mouth/Throat:     Mouth: Mucous membranes are moist.     Pharynx: Oropharynx is clear.  Eyes:     Extraocular Movements: Extraocular movements intact.  Cardiovascular:     Rate and Rhythm: Normal rate and regular rhythm.     Pulses: Normal pulses.     Heart sounds: Normal heart sounds.  Pulmonary:     Effort: Pulmonary effort is normal.     Breath sounds: Wheezing present.  Musculoskeletal:     Cervical back: Normal range of motion and neck supple.  Skin:    General: Skin is warm and dry.  Neurological:     Mental Status: She is alert and oriented to person, place, and time. Mental status is at baseline.  Psychiatric:        Mood and Affect: Mood normal.        Behavior: Behavior normal.     UC Treatments / Results  Labs (all labs ordered are listed, but only abnormal results are displayed) Labs Reviewed - No data to display  EKG   Radiology No results found.  Procedures Procedures (including critical care  time)  Medications Ordered in UC Medications - No data to display  Initial Impression / Assessment and Plan / UC Course  I have reviewed the triage vital signs and the nursing notes.  Pertinent labs & imaging results that were available during my care of the patient were reviewed by me and considered in my medical decision making (see chart for details).  Acute bronchitis  Chest x-ray negative, vital signs are stable and patient is in no signs of distress, okay for outpatient treatment, due to length of illness and wheezing on presentation, will treat for bronchitis, prescribed Z-Pak and 5-day prednisone burst as well as Tessalon and Promethazine DM for management of symptoms, may use over-the-counter medications in addition for further management, urgent care follow-up as needed  Final Clinical Impressions(s) / UC Diagnoses   Final diagnoses:  None   Discharge Instructions   None    ED Prescriptions   None    PDMP not reviewed this encounter.   Hans Eden, NP 03/18/21 1700

## 2021-03-18 NOTE — Discharge Instructions (Addendum)
Your chest x-ray was negative  However because you have been sick for two weeks we will cover for bacteria of the upper airways.   Take azithromycin every morning as directed on package this medicine it is an antibiotic to get rid of any possible bacteria  Beginning tomorrow take prednisone every morning with food for 5 days, this medication is to help reduce irritation and inflammation of the upper airway so that she may breathe better  He may use Tessalon p.o. every 8 hours to help calm her coughing  You may use cough syrup every 6 hours as needed in addition, be mindful this medication make you drowsy, if it makes you too drowsy you may use it at bedtime  You may use the good Rx website to look up your medications to find coupons  May follow-up with urgent care as needed for worsening signs of breathing

## 2021-03-29 ENCOUNTER — Encounter (INDEPENDENT_AMBULATORY_CARE_PROVIDER_SITE_OTHER): Payer: Self-pay | Admitting: Primary Care

## 2021-03-29 ENCOUNTER — Ambulatory Visit (INDEPENDENT_AMBULATORY_CARE_PROVIDER_SITE_OTHER): Payer: Self-pay | Admitting: Primary Care

## 2021-03-29 ENCOUNTER — Other Ambulatory Visit: Payer: Self-pay

## 2021-03-29 VITALS — BP 147/96 | HR 101 | Resp 16 | Wt 224.4 lb

## 2021-03-29 DIAGNOSIS — G44221 Chronic tension-type headache, intractable: Secondary | ICD-10-CM

## 2021-03-29 DIAGNOSIS — M79674 Pain in right toe(s): Secondary | ICD-10-CM

## 2021-03-29 DIAGNOSIS — I10 Essential (primary) hypertension: Secondary | ICD-10-CM

## 2021-03-29 MED ORDER — METOPROLOL SUCCINATE ER 25 MG PO TB24: 25.0000 mg | ORAL_TABLET | Freq: Every day | ORAL | 3 refills | Status: DC

## 2021-03-29 NOTE — Progress Notes (Signed)
Acute Office Visit  Subjective:    Patient ID: Brenda Calderon, female    DOB: 01/25/1996, 26 y.o.   MRN: [id number]  Chief Complaint  Patient presents with   Ingrown Toenail    HPI Ms.Damien Kyren Vaux is a 26 year old Hispanic morbid obese female (interpreter Luellen Pucker [phone])  in today for right great toe painful- She was trying to clipped toenail and the inner right side is red and toe is swollen. She has headache at least twice a week denies an aura-dizziness, nausea, sensitive to light sound or smell.  Denies the use of  drinking alcohol smoking or certain medications, eating and drinking certain products Caffeine, aged cheese, and chocolate.    This condition may be triggered or caused by: No past medical history on file.  No past surgical history on file.  Family History  Problem Relation Age of Onset   Healthy Mother    Healthy Father     Social History   Socioeconomic History   Marital status: Single    Spouse name: Not on file   Number of children: Not on file   Years of education: Not on file   Highest education level: Not on file  Occupational History   Not on file  Tobacco Use   Smoking status: Never   Smokeless tobacco: Never  Substance and Sexual Activity   Alcohol use: Not on file   Drug use: Never   Sexual activity: Never    Partners: Male  Other Topics Concern   Not on file  Social History Narrative   Not on file   Social Determinants of Health   Financial Resource Strain: Not on file  Food Insecurity: Not on file  Transportation Needs: Not on file  Physical Activity: Not on file  Stress: Not on file  Social Connections: Not on file  Intimate Partner Violence: Not on file    Outpatient Medications Prior to Visit  Medication Sig Dispense Refill   azithromycin (ZITHROMAX) 250 MG tablet Take 1 tablet (250 mg total) by mouth daily. Take first 2 tablets together, then 1 every day until finished. (Patient not taking: Reported on  03/29/2021) 6 tablet 0   benzonatate (TESSALON) 100 MG capsule Take 1 capsule (100 mg total) by mouth every 8 (eight) hours. (Patient not taking: Reported on 03/29/2021) 21 capsule 0   predniSONE (DELTASONE) 20 MG tablet Take 2 tablets (40 mg total) by mouth daily. (Patient not taking: Reported on 03/29/2021) 10 tablet 0   promethazine-dextromethorphan (PROMETHAZINE-DM) 6.25-15 MG/5ML syrup Take 5 mLs by mouth 4 (four) times daily as needed for cough. (Patient not taking: Reported on 03/29/2021) 118 mL 0   No facility-administered medications prior to visit.    Allergies  Allergen Reactions   Shrimp (Diagnostic) Hives    Review of Systems Comprehensive ROS Pertinent positive and negative noted in HPI      Objective:    Physical Exam Vitals reviewed.  Constitutional:      Appearance: She is obese.  HENT:     Head: Normocephalic.     Right Ear: Tympanic membrane and external ear normal.     Left Ear: Tympanic membrane and external ear normal.     Nose: Nose normal.  Eyes:     Extraocular Movements: Extraocular movements intact.     Pupils: Pupils are equal, round, and reactive to light.  Cardiovascular:     Rate and Rhythm: Normal rate and regular rhythm.  Pulmonary:  Effort: Pulmonary effort is normal.     Breath sounds: Normal breath sounds.  Abdominal:     General: Abdomen is flat. There is distension.     Palpations: Abdomen is soft.  Musculoskeletal:        General: Normal range of motion.     Cervical back: Normal range of motion and neck supple.  Skin:    General: Skin is warm and dry.  Neurological:     Mental Status: She is alert and oriented to person, place, and time.  Psychiatric:        Mood and Affect: Mood normal.        Behavior: Behavior normal.        Thought Content: Thought content normal.        Judgment: Judgment normal.    BP (!) 147/96    Pulse (!) 101    Resp 16    Wt 224 lb 6.4 oz (101.8 kg)    SpO2 98%    BMI 41.04 kg/m  Wt Readings from  Last 3 Encounters:  03/29/21 224 lb 6.4 oz (101.8 kg)  03/18/21 220 lb 0.3 oz (99.8 kg)  02/16/21 220 lb (99.8 kg)    Health Maintenance Due  Topic Date Due   COVID-19 Vaccine (1) Never done   HPV VACCINES (1 - Risk 3-dose series) Never done   HIV Screening  Never done   Hepatitis C Screening  Never done   TETANUS/TDAP  Never done   PAP-Cervical Cytology Screening  Never done   PAP SMEAR-Modifier  Never done   INFLUENZA VACCINE  Never done       Topic Date Due   HPV VACCINES (1 - Risk 3-dose series) Never done     No results found for: TSH No results found for: WBC, HGB, HCT, MCV, PLT Lab Results  Component Value Date   NA 142 02/16/2021   K 4.2 02/16/2021   CO2 23 02/16/2021   GLUCOSE 96 02/16/2021   BUN 14 02/16/2021   CREATININE 0.59 02/16/2021   BILITOT 0.4 02/16/2021   ALKPHOS 96 02/16/2021   AST 37 02/16/2021   ALT 44 (H) 02/16/2021   PROT 7.3 02/16/2021   ALBUMIN 4.2 02/16/2021   CALCIUM 9.0 02/16/2021   EGFR 129 02/16/2021   Lab Results  Component Value Date   CHOL 186 02/16/2021   Lab Results  Component Value Date   HDL 35 (L) 02/16/2021   Lab Results  Component Value Date   LDLCALC 128 (H) 02/16/2021   Lab Results  Component Value Date   TRIG 126 02/16/2021   Lab Results  Component Value Date   CHOLHDL 5.3 (H) 02/16/2021   Lab Results  Component Value Date   HGBA1C 5.7 (A) 02/16/2021       Assessment & Plan:  Damiah was seen today for ingrown toenail.  Diagnoses and all orders for this visit:  Great toe pain, right Ambulatory referral to Podiatry   Chronic tension-type headache, intractable -     metoprolol succinate (TOPROL-XL) 25 MG 24 hr tablet; Take 1 tablet (25 mg total) by mouth daily.  Essential hypertension Counseled on blood pressure goal of less than 130/80, low-sodium, DASH diet, medication compliance, 150 minutes of moderate intensity exercise per week. Discussed medication compliance, adverse effects.  -      metoprolol succinate (TOPROL-XL) 25 MG 24 hr tablet; Take 1 tablet (25 mg total) by mouth daily.    No orders of the defined types were placed  in this encounter.    Kerin Perna, NP

## 2021-03-29 NOTE — Patient Instructions (Signed)
Hipertensin en los adultos Hypertension, Adult La presin arterial alta (hipertensin) se produce cuando la fuerza de la sangre bombea a travs de las arterias con mucha fuerza. Las arterias son los vasos sanguneos que transportan la sangre desde el corazn al resto del cuerpo. La hipertensin hace que el corazn haga ms esfuerzo para Chiropodist y [employer] que las arterias se Teacher, music o Advertising account executive. La hipertensin no tratada o no controlada puede causar infarto de miocardio, insuficiencia cardaca, accidente cerebrovascular, enfermedad renal y otros problemas. Una lectura de la presin arterial consta de un nmero ms alto sobre un nmero ms bajo. En condiciones ideales, la presin arterial debe estar por debajo de 120/80. El Teacher, English as a foreign language (superior) es la presin sistlica. Es la medida de la presin de las arterias cuando el corazn late. El segundo nmero (inferior) es la presin diastlica. Es la medida de la presin en las arterias cuando el corazn se relaja. Cules son las causas? Se desconoce la causa exacta de esta afeccin. Hay algunas afecciones que causan presin arterial alta o estn relacionadas con ella. Qu incrementa el riesgo? Algunos factores de riesgo de hipertensin estn bajo su control. Los siguientes factores pueden hacer que sea ms propenso a Armed forces training and education officer afeccin: Fumar. Tener diabetes mellitus tipo 2, colesterol alto, o ambos. No hacer la cantidad suficiente de actividad fsica o ejercicio. Tener sobrepeso. Consumir mucha grasa, azcar, caloras o sal (sodio) en su dieta. Beber alcohol en exceso. Algunos factores de riesgo para la presin arterial alta pueden ser difciles o imposibles de Quarry manager. Algunos de estos factores son los siguientes: Tener enfermedad renal crnica. Tener antecedentes familiares de presin arterial alta. Edad. Los riesgos aumentan con la edad. Raza. El riesgo es mayor para las Retail banker. Sexo. Antes de los  45 aos, los hombres corren ms [employer]. Despus de los 65 aos, las mujeres corren ms [employer]. Tener apnea obstructiva del sueo. Estrs. Cules son los signos o los sntomas? Es posible que la presin arterial alta puede no cause sntomas. La presin arterial muy alta (crisis hipertensiva) puede provocar: Dolor de cabeza. Ansiedad. Falta de aire. Hemorragia nasal. Nuseas y vmitos. Cambios en la visin. Dolor de pecho intenso. Convulsiones. Cmo se diagnostica? Esta afeccin se diagnostica al medir su presin arterial mientras se encuentra sentado, con el brazo apoyado sobre una superficie plana, las piernas sin cruzar y los pies bien apoyados en el piso. El brazalete del tensimetro debe colocarse directamente sobre la piel de la parte superior del brazo y al nivel de su corazn. Debe medirla al [facility] veces en el mismo brazo. Determinadas condiciones pueden causar una diferencia de presin arterial entre el brazo izquierdo y Insurance underwriter. Ciertos factores pueden provocar que las lecturas de la presin arterial sean inferiores o superiores a lo normal por un perodo corto de tiempo: Si su presin arterial es ms alta cuando se encuentra en el consultorio del mdico que cuando la mide en su hogar, se denomina hipertensin de bata blanca. La [employer] de las personas que tienen esta afeccin no deben ser [employer]. Si su presin arterial es ms alta en el hogar que cuando se encuentra en el consultorio del mdico, se denomina hipertensin enmascarada. La [employer] de las personas que tienen esta afeccin deben ser medicadas para Chief Technology Officer la presin arterial. Si tiene una lecturas de presin arterial alta durante una visita o si tiene presin arterial normal con otros factores de riesgo, se le podr pedir que haga lo siguiente:  Que regrese [employer] para volver a Chief Technology Officer su presin arterial nuevamente. Que se controle la presin arterial en su casa durante 1  semana o ms. Si se le diagnostica hipertensin, es posible que se le realicen otros anlisis de sangre o estudios de diagnstico por imgenes para ayudar a su mdico a comprender su riesgo general de tener otras afecciones. Cmo se trata? Esta afeccin se trata haciendo cambios saludables en el estilo de vida, tales como ingerir alimentos saludables, realizar ms ejercicio y reducir el consumo de alcohol. El mdico puede recetarle medicamentos si los cambios en el estilo de vida no son suficientes para Child psychotherapist la presin arterial y si: Su presin arterial sistlica est por encima de 130. Su presin arterial diastlica est por encima de 80. La presin arterial deseada puede variar en funcin de las enfermedades, la edad y otros factores personales. Siga estas instrucciones en su casa: Comida y bebida  Siga una dieta con alto contenido de fibras y [place], y con bajo contenido de sodio, Location manager agregada y Physicist, medical. Un ejemplo de plan alimenticio es la dieta DASH (Dietary Approaches to Stop Hypertension, Mtodos alimenticios para detener la hipertensin). Para alimentarse de esta manera: Coma mucha fruta y [place]. Trate de que la mitad del plato de cada comida sea de frutas y verduras. Coma cereales integrales, como pasta integral, arroz integral o pan integral. Llene aproximadamente un cuarto del plato con cereales integrales. Coma y beba productos lcteos con bajo contenido de grasa, como leche descremada o yogur bajo en grasas. Evite la ingesta de cortes de carne grasa, carne procesada o curada, y carne de ave con piel. Llene aproximadamente un cuarto del plato con protenas magras, como pescado, pollo sin piel, frijoles, huevos o tofu. Evite ingerir alimentos prehechos y procesados. En general, estos tienen mayor cantidad de sodio, azcar agregada y Wendee Copp. Reduzca su ingesta diaria de sodio. La mayora de las personas que tienen hipertensin deben comer menos de 1500 mg de sodio  por [employer]. No beba alcohol si: Su mdico le indica no hacerlo. Est embarazada, puede estar embarazada o est tratando de [place]. Si bebe alcohol: Limite la cantidad que bebe a lo siguiente: De 0 a 1 medida por da para las mujeres. De 0 a 2 medidas por da para los hombres. Est atento a la cantidad de alcohol que hay en las bebidas que toma. En los [place], una medida equivale a una botella de cerveza de 12 oz (355 ml), un vaso de vino de 5 oz (148 ml) o un vaso de una bebida alcohlica de alta graduacin de 1 oz (44 ml). Estilo de vida  Trabaje con su mdico para mantener un peso saludable o Administrator, Civil Service. Pregntele cul es el peso recomendado para usted. Haga al menos 30 minutos de ejercicio la [employer] de la [place]. Estas actividades pueden incluir caminar, nadar o andar en bicicleta. Incluya ejercicios para fortalecer sus msculos (ejercicios de resistencia), como Pilates o levantamiento de pesas, como parte de su rutina semanal de ejercicios. Intente realizar 30 minutos de este tipo de ejercicios al [employer] a la [place]. No consuma ningn producto que contenga nicotina o tabaco, como cigarrillos, cigarrillos electrnicos y tabaco de Higher education careers adviser. Si necesita ayuda para dejar de fumar, consulte al mdico. Contrlese la presin arterial en su casa segn las indicaciones del mdico. Concurra a todas las visitas de seguimiento como se lo haya indicado el mdico. Esto es importante. Medicamentos Delphi de venta  libre y los recetados solamente como se lo haya indicado el mdico. Siga cuidadosamente las indicaciones. Los medicamentos para la presin arterial deben tomarse segn las indicaciones. No omita las dosis de medicamentos para la presin arterial. Si lo hace, estar en riesgo de tener problemas y puede hacer que los medicamentos sean menos eficaces. Pregntele a su mdico a qu efectos secundarios o reacciones a los Journalist, newspaper. Comunquese con un mdico si: Piensa que tiene una reaccin a un medicamento que est tomando. Tiene dolores de cabeza frecuentes (recurrentes). Se siente mareado. Tiene hinchazn en los tobillos. Tiene problemas de visin. Solicite ayuda inmediatamente si: Siente un dolor de cabeza intenso o confusin. Siente debilidad inusual o adormecimiento. Siente que va a desmayarse. Siente un dolor intenso en el pecho o el abdomen. Vomita repetidas veces. Tiene dificultad para respirar. Resumen La hipertensin se produce cuando la sangre bombea en las arterias con mucha fuerza. Si esta afeccin no se controla, podra correr riesgo de tener complicaciones graves. La presin arterial deseada puede variar en funcin de las enfermedades, la edad y otros factores personales. Para la [employer], una presin arterial normal es menor que 120/80. La hipertensin se trata con cambios en el estilo de vida, medicamentos o una combinacin de [place]. Los [employer] Corporation estilo de vida incluyen prdida de peso, ingerir alimentos sanos, seguir una dieta baja en sodio, hacer ms ejercicio y Glass blower/designer consumo de alcohol. Esta informacin no tiene Theme park manager el consejo del mdico. Asegrese de hacerle al mdico cualquier pregunta que tenga. Document Revised: 11/14/2017 Document Reviewed: 11/14/2017 Elsevier Patient Education  2022 Elsevier Inc. Dolor de cabeza general sin causa General Headache Without Cause El dolor de cabeza es un dolor o Dentist que se siente en la zona de la cabeza o del cuello. Hay muchas causas y tipos de dolores de [place]. En algunos casos, es posible que no se encuentre la causa. Siga estas indicaciones en su casa: Controle su afeccin para detectar cualquier cambio. Infrmele a su mdico acerca de los cambios. Siga estos pasos para Chief Operating Officer su afeccin: Control del Copy los medicamentos de venta libre y los recetados solamente como se lo haya indicado  el mdico. Estos incluyen medicamentos para el dolor que se toman por boca o se colocan en la piel. Cuando sienta dolor de cabeza acustese en un cuarto oscuro y tranquilo. Si se lo indican, aplquese hielo en la cabeza y en la zona del cuello: Ponga el hielo en una bolsa plstica. Coloque una toalla entre la piel y Copy. Aplique el hielo durante 20 minutos, 2 a 3 veces al da. Retire el hielo si la piel se le pone de color rojo brillante. Esto es Intel. Si no puede sentir dolor, calor o fro, tiene un mayor riesgo de que se dae la zona. Si se lo indican, aplique calor en la zona afectada. Use la fuente de calor que el mdico le recomiende, como una compresa de calor hmedo o una almohadilla trmica. Coloque una toalla entre la piel y la fuente de Airline pilot. Aplique calor durante 20 a 30 minutos. Retire la fuente de calor si la piel se le pone de color rojo brillante. Esto es [employer]. Si no puede sentir dolor, calor o fro, tiene un mayor riesgo de [place]. Mantenga las luces tenues si las luces brillantes le molestan o sus dolores de cabeza [place]. Comida y bebida Mantenga un horario para las comidas. Si bebe  alcohol: Limite la cantidad que bebe a lo siguiente: De 0 a 1 medida por da para las mujeres que no estn embarazadas. De 0 a 2 medidas por da para los hombres. Sepa cunta cantidad de alcohol hay en las bebidas. En los [address], una medida equivale a una botella de cerveza de 12 oz (355 ml), un vaso de vino de 5 oz (148 ml) o un vaso de una bebida alcohlica de alta graduacin de 1 oz (44 ml). Deje de tomar cafena o consuma menos. Indicaciones generales  Lleve un registro diario para averiguar si ciertas cosas provocan los dolores de [place]. Registre, por ejemplo, lo siguiente: Lo que usted come y bebe. El tiempo que duerme. Algn cambio en su dieta o en los medicamentos. Hgase masajes o pruebe otras formas de relajarse. Limite el estrs. Sintese con  la espalda recta. No contraiga (tensione) los msculos. No fume ni consuma ningn producto que contenga nicotina o tabaco. Si necesita ayuda para dejar de consumir estos productos, consulte al mdico. Haga ejercicios con regularidad tal como se lo indic el mdico. Duerma lo suficiente. Esto a menudo significa entre 7 y 9 horas de sueo cada noche. Concurra a todas las visitas de seguimiento. Esto es importante. Comunquese con un mdico si: Los medicamentos no [place]. Tiene un dolor de cabeza que es diferente a los otros dolores de [place]. Tiene ganas de vomitar (nuseas) o vomita. Tiene fiebre. Solicite ayuda de inmediato si: El dolor de cabeza: Se pone muy fuerte rpidamente. Empeora despus de [employer] fsica. Tiene alguno de estos sntomas: Sigue vomitando. Rigidez en el cuello. Dificultad para ver. Le duele el ojo o el odo. Dificultad para hablar. Siente los msculos dbiles o pierde el control muscular. Pierde el equilibrio o tiene problemas para Advertising account planner. Siente que va a desvanecerse (perder el conocimiento) o se desmaya. Est desorientado (confundido). Tiene una convulsin. Estos sntomas pueden Customer service manager. Solicite ayuda de inmediato. Comunquese con el servicio de emergencias de su localidad (911 en los [place]). No espere a ver si los sntomas desaparecen. No conduzca por sus propios medios [employer]. Resumen El dolor de cabeza es un dolor o malestar que se siente en la zona de la cabeza o del cuello. Hay muchas causas y tipos de dolores de [place]. En algunos casos, es posible que no se encuentre la causa. Lleve un diario como ayuda para [employer] causa de los dolores de [place]. Controle su afeccin para Insurance risk surveyor cambio. Infrmele a su mdico acerca de los cambios. Comunquese con un mdico si tiene Herbalist de cabeza que es diferente de lo habitual o si los medicamentos no le alivian el dolor de  [place]. Solicite ayuda de inmediato si el dolor de cabeza es muy intenso, vomita, tiene dificultad para ver, pierde el equilibrio o tiene una convulsin. Esta informacin no tiene Theme park manager el consejo del mdico. Asegrese de hacerle al mdico cualquier pregunta que tenga. Document Revised: 07/19/2020 Document Reviewed: 07/19/2020 Elsevier Patient Education  2022 [employer].

## 2021-03-30 ENCOUNTER — Other Ambulatory Visit: Payer: Self-pay

## 2021-03-30 MED ORDER — METOPROLOL SUCCINATE ER 25 MG PO TB24
ORAL_TABLET | ORAL | 2 refills | Status: DC
  Filled 2021-03-30: qty 30, 30d supply, fill #0

## 2021-04-06 ENCOUNTER — Ambulatory Visit (INDEPENDENT_AMBULATORY_CARE_PROVIDER_SITE_OTHER): Payer: No Typology Code available for payment source | Admitting: Podiatry

## 2021-04-06 ENCOUNTER — Other Ambulatory Visit: Payer: Self-pay

## 2021-04-06 DIAGNOSIS — L600 Ingrowing nail: Secondary | ICD-10-CM

## 2021-04-06 NOTE — Patient Instructions (Signed)

## 2021-04-10 ENCOUNTER — Encounter: Payer: Self-pay | Admitting: Podiatry

## 2021-04-10 NOTE — Progress Notes (Signed)
°  Subjective:  Patient ID: Brenda Calderon, female    DOB: Jan 17, 1996,  MRN: [id number]  Chief Complaint  Patient presents with   Ingrown Toenail    Bilat hallux- right is worse than left- more swelling and redness medial border-     26 y.o. female presents with the above complaint. History confirmed with patient.   Objective:  Physical Exam: warm, good capillary refill, no trophic changes or ulcerative lesions, normal DP and PT pulses, normal sensory exam, and ingrowing lateral nail border bilateral hallux right worse than left. Assessment:   1. Ingrowing right great toenail   2. Ingrowing left great toenail      Plan:  Patient was evaluated and treated and all questions answered.    Ingrown Nail, bilaterally -Patient elects to proceed with minor surgery to remove ingrown toenail today. Consent reviewed and signed by patient. -Ingrown nail excised. See procedure note. -Educated on post-procedure care including soaking. Written instructions provided and reviewed. -Patient to follow up in 2 weeks for nail check.  Procedure: Excision of Ingrown Toenail Location: Bilateral 1st toe lateral nail borders. Anesthesia: Lidocaine 1% plain; 1.5 mL and Marcaine 0.5% plain; 1.5 mL, digital block. Skin Prep: Betadine. Dressing: Silvadene; telfa; dry, sterile, compression dressing. Technique: Following skin prep, the toe was exsanguinated and a tourniquet was secured at the base of the toe. The affected nail border was freed, split with a nail splitter, and excised. Chemical matrixectomy was then performed with phenol and irrigated out with alcohol. The tourniquet was then removed and sterile dressing applied. Disposition: Patient tolerated procedure well. Patient to return in 2 weeks for follow-up.    Return in about 2 weeks (around 04/20/2021) for nail re-check.

## 2021-04-24 ENCOUNTER — Other Ambulatory Visit: Payer: Self-pay

## 2021-04-24 ENCOUNTER — Ambulatory Visit (INDEPENDENT_AMBULATORY_CARE_PROVIDER_SITE_OTHER): Payer: No Typology Code available for payment source | Admitting: Podiatry

## 2021-04-24 DIAGNOSIS — L600 Ingrowing nail: Secondary | ICD-10-CM

## 2021-05-01 ENCOUNTER — Encounter: Payer: Self-pay | Admitting: Podiatry

## 2021-05-01 NOTE — Progress Notes (Signed)
?  Subjective:  ?Patient ID: Brenda Calderon, female    DOB: Oct 17, 1995,  MRN: [id number] ? ?Follow-up from nail removal ? ?26 y.o. female presents with the above complaint. History confirmed with patient.  ? ?Objective:  ?Physical Exam: ?warm, good capillary refill, no trophic changes or ulcerative lesions, normal DP and PT pulses, normal sensory exam, and matricectomy sites are healing well ?Assessment:  ? ?No diagnosis found. ? ? ? ?Plan:  ?Patient was evaluated and treated and all questions answered. ? ? ? ?Ingrown Nail, bilaterally ?-Ingrown nails are healing well she can leave these open to air and discontinue soaks and ointment at this point ? ? ?Return if symptoms worsen or fail to improve.  ? ?

## 2021-05-29 ENCOUNTER — Ambulatory Visit (INDEPENDENT_AMBULATORY_CARE_PROVIDER_SITE_OTHER): Payer: Self-pay | Admitting: Primary Care

## 2021-05-30 ENCOUNTER — Ambulatory Visit (INDEPENDENT_AMBULATORY_CARE_PROVIDER_SITE_OTHER): Payer: Self-pay | Admitting: Primary Care

## 2021-05-30 ENCOUNTER — Encounter (INDEPENDENT_AMBULATORY_CARE_PROVIDER_SITE_OTHER): Payer: Self-pay | Admitting: Primary Care

## 2021-05-30 VITALS — BP 126/79 | HR 86 | Temp 98.4°F | Ht 62.0 in | Wt 226.0 lb

## 2021-05-30 DIAGNOSIS — G44221 Chronic tension-type headache, intractable: Secondary | ICD-10-CM

## 2021-05-30 DIAGNOSIS — Z0130 Encounter for examination of blood pressure without abnormal findings: Secondary | ICD-10-CM

## 2021-05-30 DIAGNOSIS — Z7189 Other specified counseling: Secondary | ICD-10-CM

## 2021-05-30 NOTE — Patient Instructions (Signed)
Informaci?n sobre el VPH y el c?ncer ?HPV and Cancer Information ?El VPH (virus del papiloma humano)es un virus muy com?n que se transmite de Neomia Dear persona a otra a trav?s del contacto piel con piel o del contacto sexual. Hay varios tipos de VPH. A menudo no causa s?ntomas. Sin embargo, dependiendo del tipo, a Event organiser (VPH genitalo mucoso) o en las manos o los pies (VPH cut?neo o no mucoso). Las personas pueden estar infectadas con el VPH por un Harle Battiest y transmitirlo a Economist sin saberlo. ?Algunas infecciones por el VPH desaparecen por s? solas en un per?odo de 2 a?os, pero otras infecciones por el VPH se consideran de alto riesgo y pueden causar cambios en las c?lulas que pueden Soil scientist. Usted puede tomar medidas para evitar la infecci?n por el VPH y disminuir el riesgo de Education administrator. ??C?mo puede afectarme el VPH? ?El VPH puede provocar verrugas en los genitales o en las manos o los pies. Tambi?n puede causar lesiones similares a verrugas en la garganta.  ?Ciertos tipos de VPH genital tambi?n pueden provocar c?ncer, [employer] se pueden incluir: ?C?ncer de cuello uterino. ?C?ncer vaginal. ?C?ncer vulvar. ?C?ncer anal. ?C?ncer de garganta. ?C?ncer en la lengua o la boca. ?C?ncer de pene. ??C?mo se transmite el VPH? ?El VPH se propaga f?cilmente a trav?s del contacto directo Rolly Salter persona y [place]. El VPH genital se transmite a trav?s del contacto sexual. Puede contraer VPH por sexo vaginal, oral, anal o simplemente por el contacto con los genitales de Engineer, maintenance (IT). Incluso las [employer] tienen una sola pareja sexual pueden tener VPH porque esa pareja puede tenerlo. El VPH con frecuencia no causa s?ntomas, por lo que la mayor?a de las personas infectadas no saben que lo tienen. ??Qu? medidas puedo tomar para prevenir el VPH? ?Tome las siguientes medidas para prevenir la infecci?n por el VPH: ?Acupuncturist con su m?dico acerca de c?mo recibir la  Animal nutritionist VPH. Esta vacuna la protege contra los tipos de VPH que podr?an provocar c?ncer. ?Limite la cantidad de personas con las que [employer]. Adem?s, evite tener sexo con personas que han tenido demasiadas parejas sexuales. ?Use preservativos durante las relaciones sexuales. ?Hable con su pareja sexual acerca de su salud. ??Qu? medidas puedo tomar para reducir mi riesgo de tener c?ncer? ?Tener un estilo de vida saludable y tomar algunas medidas preventivas pueden ayudarle a disminuir su riesgo de Soil scientist, tanto si tiene o no el VPH genital. Algunas de las medidas que puede implementar, [place], entre otras: ?Campanillas de vida ?Practicar el sexo seguro para prevenir la infecci?n por el VPH. ?No consuma ning?n producto que contenga nicotina o tabaco, como cigarrillos, cigarrillos electr?nicos y tabaco de Theatre manager. Si necesita ayuda para dejar de fumar, consulte al m?dico. ?Consuma alimentos que contengan antioxidantes, como frutas, verduras y Medical laboratory scientific officer. Intente consumir, como m?nimo, 5 porciones de frutas y verduras todos los d?as. ?Haga ejercicio con regularidad. ?Baje de peso si es necesario. ?Mantenga una buena higiene bucal. Esto incluye el uso del hilo dental y el cepillado diario. ?Otras medidas de prevenci?n ?Apl?quese la vacuna contra el VPH como se lo haya indicado el m?dico. ?H?gase los estudios de detecci?n de ITS aunque no tenga s?ntomas del VPH. Es posible que tenga VPH y no sepa que lo tiene. ?Si es mujer, debe hacerse pruebas de Papanicolaou y VPH con regularidad. Hable con el m?dico sobre la frecuencia con la que debe hacerse estas pruebas. Las [employer]  de Papanicolaou ayudan a Engineer, manufacturing los cambios en las c?lulas que pueden desarrollar c?ncer. Las pruebas del VPH ayudar?n a identificar la presencia del VPH en las c?lulas del cuello uterino. ?D?nde buscar m?s informaci?n ?Obtenga m?s informaci?n sobre el VPH y el c?ncer en: ?Marine scientist for [employer] and Prevention  (Centros para el Control y la Prevenci?n de [place]): RunningConvention.de ?National Cancer Institute [employer] del C?ncer): www.cancer.gov ?American Cancer Society (Sociedad Estadounidense contra el C?ncer): www.cancer.org ?Comun?quese con un m?dico si: ?Tiene verrugas genitales. ?Es sexualmente [place] y cree que puede tener VPH. ?No se protegi? durante las relaciones sexuales y quisiera realizarse pruebas de detecci?n de ITS. ?Resumen ?El virus del Geneticist, molecular (VPH) es un virus muy com?n que se transmite f?cilmente de Neomia Dear persona a otra y Public relations account executive. ?Ciertos tipos de VPH genital se consideran de algo riesgo y pueden causar cambios en las c?lulas que pueden derivar en c?ncer. ?Debe tomar medidas para evitar la infecci?n por el VPH, como limitar el n?mero de Chief Strategy Officer, usar preservativos cuando tiene sexo y aplicarse la vacuna contra el VPH. ?Hacer cambios en su estilo de vida ayuda a reducir el riesgo de desarrollar c?ncer. Estos cambios incluyen tener una dieta saludable, hacer ejercicios regularmente y no consumir productos que contengan nicotina ni tabaco. ?Es posible que tenga VPH y no sepa que lo tiene. H?gase los estudios de detecci?n de ITS aunque no tenga s?ntomas del VPH. Si es mujer, debe hacerse pruebas de Papanicolaou y de VPH con regularidad como se lo haya indicado el m?dico. ?Esta informaci?n no tiene Theme park manager el consejo del m?dico. Aseg?rese de hacerle al m?dico cualquier pregunta que tenga. ?Document Revised: 11/20/2019 Document Reviewed: 11/20/2019 ?Elsevier Patient Education ? 2023 Elsevier Inc. ?Recuento de calor?as para bajar de peso ?Calorie Counting for Weight Loss ?Las calor?as son unidades de energ?a. El cuerpo necesita una cierta cantidad de calor?as de los alimentos para que lo ayuden a funcionar durante todo el d?a. Cuando se comen o beben m?s calor?as de las que el cuerpo necesita, este acumula las calor?as adicionales mayormente como grasa.  Cuando se comen o beben menos calor?as de las que el cuerpo [place], este quema grasa para obtener la energ?a que necesita. ?El recuento de calor?as es Manufacturing engineer de la cantidad de calor?as que se comen y beben cada d?a. El recuento de calor?as puede ser de ayuda si necesita perder peso. Si come menos calor?as de las que el cuerpo [place], deber?a bajar [employer]. Preg?ntele al m?dico cu?l es un peso sano para usted. ?Para que el recuento de calor?as funcione, usted tendr? que ingerir la cantidad de calor?as adecuadas cada d?a, para bajar una cantidad de peso saludable por semana. Un nutricionista puede ayudar a determinar la cantidad de calor?as que usted necesita por d?a y sugerirle formas de Barista su objetivo cal?rico. ?Una cantidad de peso saludable para bajar cada semana suele ser entre 1 y 2 libras (0.5 a 0.9 kg). Esto habitualmente significa que su ingesta diaria de calor?as se deber?a reducir en unas 500 a 750 calor?as. ?Ingerir de 1200 a 1500 calor?as por d?a puede ayudar a la mayor?a de las mujeres a Publishing copy de [place]. ?Ingerir de 1500 a 1800 calor?as por d?a puede ayudar a la mayor?a de los hombres a Publishing copy de [place]. ??Qu? debo saber acerca del recuento de calor?as? ?Trabaje con el m?dico o el nutricionista para determinar cu?ntas calor?as debe recibir cada d?a. A fin de alcanzar su objetivo diario de calor?as, tendr? que: ?Averiguar cu?ntas calor?as  hay en cada alimento que le gustar?a comer. Intente hacerlo antes de comer. ?Decidir la cantidad que puede comer del alimento. ?Llevar un registro de los alimentos. Para esto, anote lo que comi? y cu?ntas calor?as ten?a. ?Para perder peso con ?xito, es importante equilibrar el recuento de calor?as con un estilo de vida saludable que incluya actividad f?sica de forma regular. ??D?nde encuentro informaci?n sobre las calor?as? ? ?Es posible Veterinary surgeonencontrar la cantidad de calor?as que contiene un alimento en la etiqueta de informaci?n nutricional. Si un alimento no tiene  una etiqueta de informaci?n nutricional, intente buscar las calor?as en Internet o pida ayuda al nutricionista. ?Recuerde que las calor?as se calculan por porci?n. Si opta por comer m?s de una porci?n de

## 2021-05-30 NOTE — Progress Notes (Signed)
?[facility] ? ?Brenda Calderon, is a 26 y.o. female ? ?HYW:[id number] ? ?[id number] ? ?DOB - [birth year] ? ?Chief Complaint  ?Patient presents with  ? Follow-up  ?  Bp/headaches  ?    ? ?Subjective:  ? ?Brenda Calderon is a 26 y.o. morbid obese Hispanic female (interpreter [place] [phone] today for a follow up Bp and headaches. She did voice concern about breast tenderness. Patient has No headache, No chest pain, No abdominal pain - No Nausea, No new weakness tingling or numbness, No Cough - shortness of breath.  ?No problems updated. ? ?Allergies  ?Allergen Reactions  ? Shrimp (Diagnostic) Hives  ? ? ?History reviewed. No pertinent past medical history. ? ?Current Outpatient Medications on File Prior to Visit  ?Medication Sig Dispense Refill  ? metoprolol succinate (TOPROL-XL) 25 MG 24 hr tablet Take 1 tab by mouth daily 90 tablet 2  ? ?No current facility-administered medications on file prior to visit.  ? ?Comprehensive ROS Pertinent positive and negative noted in HPI   ?Objective:  ? ?Vitals:  ? 05/30/21 1553  ?BP: 126/79  ?Pulse: 86  ?Temp: 98.4 ?F (36.9 ?C)  ?TempSrc: Oral  ?SpO2: 96%  ?Weight: 226 lb (102.5 kg)  ?Height: 5\' 2"  (1.575 m)  ? ? ?Exam ?General appearance : Awake, alert, not in any distress. Speech Clear. Not toxic looking ?HEENT: Atraumatic and Normocephalic, pupils equally reactive to light and accomodation ?Neck: Supple, no JVD. No cervical lymphadenopathy.  ?Chest: Good air entry bilaterally, no added sounds  ? Breast -Symmetric in size. No masses, skin changes, nipple drainage, or lymphadenopathy. Taught self breast exam and had patient to demonstrate SBE. ?CVS: S1 S2 regular, no murmurs.  ?Abdomen: Bowel sounds present, Non tender and not distended with no gaurding, rigidity or rebound. ?Extremities: B/L Lower Ext shows no edema, both legs are warm to touch ?Neurology: Awake alert, and oriented X 3,  Non focal ?Skin: No Rash ? ?Data Review ?Lab Results   ?Component Value Date  ? HGBA1C 5.7 (A) 02/16/2021  ? ? ?Assessment & Plan  ?Morayo was seen today for follow-up. ? ?Diagnoses and all orders for this visit: ? ?BP check ?Unremarkable without medication- started managing her sodium intake and excercising  ? ?Chronic tension-type headache, intractable ?Headache Management:  ?Cool Compress. Lie down and place a cool compress on your head.  ?Avoid headache triggers. If certain foods or odors seem to have triggered your migraines in the past, avoid them. A headache diary might help you identify triggers.  ?Include physical activity in your daily routine. Try a daily walk or other moderate aerobic exercise.  ?Manage stress. Find healthy ways to cope with the stressors, such as delegating tasks on your to-do list.  ?Practice relaxation techniques. Try deep breathing, yoga, massage and visualization.  ?Eat regularly. Eating regularly scheduled meals and maintaining a healthy diet might help prevent headaches. Also, drink plenty of fluids.  ?Follow a regular sleep schedule. Sleep deprivation might contribute to headaches ?Consider biofeedback. With this mind-body technique, you learn to control certain bodily functions -- such as muscle tension, heart rate and blood pressure -- to prevent headaches or reduce headache pain.   ? ?Morbid obesity (HCC) ?Morbid Obesity is >40 indicating an excess in caloric intake or underlining conditions. This may lead to other co-morbidities. Lifestyle modifications of diet and exercise may reduce obesity.   ? ?Breast self examination education, encounter for ? Symmetric in size. No masses, skin changes, nipple drainage, or lymphadenopathy. Taught self  breast exam and had patient to demonstrate SBE. ?  ? ? ? ?Patient have been counseled extensively about nutrition and exercise. Other issues discussed during this visit include: low cholesterol diet, weight control and daily exercise, foot care, annual eye examinations at Ophthalmology,  importance of adherence with medications and regular follow-up. We also discussed long term complications of uncontrolled diabetes and hypertension.  ? ?Return in about 1 year (around 05/31/2022) for annual . ? ?The patient was given clear instructions to go to ER or return to medical center if symptoms don't improve, worsen or new problems develop. The patient verbalized understanding. The patient was told to call to get lab results if they haven't heard anything in the next week.  ? ?This note has been created with Education officer, environmental. Any transcriptional errors are unintentional.  ? ?Grayce Sessions, NP ?06/04/2021, 7:29 PM ? ?

## 2021-08-16 ENCOUNTER — Ambulatory Visit (INDEPENDENT_AMBULATORY_CARE_PROVIDER_SITE_OTHER): Payer: Self-pay | Admitting: Primary Care

## 2022-01-09 ENCOUNTER — Ambulatory Visit: Payer: Self-pay

## 2022-01-23 ENCOUNTER — Ambulatory Visit: Payer: Self-pay

## 2022-01-25 ENCOUNTER — Ambulatory Visit: Payer: Self-pay | Attending: Family Medicine

## 2022-05-17 ENCOUNTER — Ambulatory Visit (INDEPENDENT_AMBULATORY_CARE_PROVIDER_SITE_OTHER): Payer: Self-pay | Admitting: Primary Care

## 2022-05-18 ENCOUNTER — Encounter (INDEPENDENT_AMBULATORY_CARE_PROVIDER_SITE_OTHER): Payer: Self-pay | Admitting: Primary Care

## 2022-05-18 ENCOUNTER — Ambulatory Visit (INDEPENDENT_AMBULATORY_CARE_PROVIDER_SITE_OTHER): Payer: Self-pay | Admitting: Primary Care

## 2022-05-18 VITALS — BP 113/72 | HR 82 | Resp 16 | Ht 62.0 in | Wt 205.2 lb

## 2022-05-18 DIAGNOSIS — E6609 Other obesity due to excess calories: Secondary | ICD-10-CM

## 2022-05-18 DIAGNOSIS — Z6837 Body mass index (BMI) 37.0-37.9, adult: Secondary | ICD-10-CM

## 2022-05-18 DIAGNOSIS — H9201 Otalgia, right ear: Secondary | ICD-10-CM

## 2022-05-18 NOTE — Patient Instructions (Signed)
  Qu es el aceite dulce para la infeccin de odo? El aceite dulce tambin es popular por sus propiedades que pueden ofrecer alivio para los dolores de odo y las infecciones menores de odo. Sus propiedades Radiation protection practitioner, IT trainer antiinflamatorios y accin antibacteriana contribuyen potencialmente a Paramedic las molestias relacionadas con el odo.

## 2022-05-18 NOTE — Progress Notes (Unsigned)
  [facility]  [facility] Brenda Calderon, is a 27 y.o. female  [id number]:[id number]  [id number]  DOB - 06/23/1995  Chief Complaint  Patient presents with   Ear Pain    Right ear Started Wednesday        Subjective:  Mr. Brenda Calderon is a 27 y.o. morbid obese Hispanic female (interpreter Bayard Hugger [phone] today for a follow acute visit right ear pain . Denies discharge or entering anything in her ear. Water does enter her ears with showering. No fever pain level 7.  She did voice concern about breast tenderness. Patient has No headache, No chest pain, No abdominal pain - No Nausea, No new weakness tingling or numbness, No Cough - shortness of breath.  No problems updated.  Patient has No headache, No chest pain, No abdominal pain - No Nausea, No new weakness tingling or numbness, No Cough - shortness of breath  No problems updated.  Allergies  Allergen Reactions   Shrimp (Diagnostic) Hives    No past medical history on file.  Current Outpatient Medications on File Prior to Visit  Medication Sig Dispense Refill   metoprolol succinate (TOPROL-XL) 25 MG 24 hr tablet Take 1 tab by mouth daily (Patient not taking: Reported on 05/18/2022) 90 tablet 2   No current facility-administered medications on file prior to visit.    Objective:   Vitals:   05/18/22 0924  BP: 113/72  Pulse: 82  Resp: 16  SpO2: 99%  Weight: 205 lb 3.2 oz (93.1 kg)  Height: 5\' 2"  (1.575 m)    Comprehensive ROS Pertinent positive and negative noted in HPI   Exam General appearance : Awake, alert, not in any distress. Speech Clear. Not toxic looking HEENT: Atraumatic and Normocephalic, pupils equally reactive to light and accomodation Neck: Supple, no JVD. No cervical lymphadenopathy.  Chest: Good air entry bilaterally, no added sounds  CVS: S1 S2 regular, no murmurs.  Abdomen: Bowel sounds present, Non tender and not distended with no gaurding, rigidity or rebound. Extremities:  B/L Lower Ext shows no edema, both legs are warm to touch Neurology: Awake alert, and oriented X 3,  Non focal Skin: No Rash  Data Review Lab Results  Component Value Date   HGBA1C 5.7 (A) 02/16/2021    Assessment & Plan  Brenda Calderon was seen today for ear pain.  Diagnoses and all orders for this visit:  Right ear pain    Patient have been counseled extensively about nutrition and exercise. Other issues discussed during this visit include: low cholesterol diet, weight control and daily exercise, foot care, annual eye examinations at Ophthalmology, importance of adherence with medications and regular follow-up. We also discussed long term complications of uncontrolled diabetes and hypertension.   No follow-ups on file.  The patient was given clear instructions to go to ER or return to medical center if symptoms don't improve, worsen or new problems develop. The patient verbalized understanding. The patient was told to call to get lab results if they haven't heard anything in the next week.   This note has been created with Education officer, environmental. Any transcriptional errors are unintentional.   Grayce Sessions, NP 05/18/2022, 10:13 AM

## 2022-05-31 ENCOUNTER — Ambulatory Visit (INDEPENDENT_AMBULATORY_CARE_PROVIDER_SITE_OTHER): Payer: Self-pay | Admitting: Primary Care

## 2022-06-12 ENCOUNTER — Encounter (INDEPENDENT_AMBULATORY_CARE_PROVIDER_SITE_OTHER): Payer: Self-pay | Admitting: Primary Care

## 2022-06-12 ENCOUNTER — Ambulatory Visit (INDEPENDENT_AMBULATORY_CARE_PROVIDER_SITE_OTHER): Payer: Self-pay | Admitting: Primary Care

## 2022-06-12 ENCOUNTER — Other Ambulatory Visit (HOSPITAL_COMMUNITY)
Admission: RE | Admit: 2022-06-12 | Discharge: 2022-06-12 | Disposition: A | Discharge status: Home / Self Care | Payer: Self-pay | Source: Ambulatory Visit | Attending: Primary Care | Admitting: Primary Care

## 2022-06-12 VITALS — BP 114/76 | HR 64 | Resp 16 | Ht 62.0 in | Wt 206.2 lb

## 2022-06-12 DIAGNOSIS — Z1159 Encounter for screening for other viral diseases: Secondary | ICD-10-CM

## 2022-06-12 DIAGNOSIS — Z3009 Encounter for other general counseling and advice on contraception: Secondary | ICD-10-CM

## 2022-06-12 DIAGNOSIS — Z124 Encounter for screening for malignant neoplasm of cervix: Secondary | ICD-10-CM

## 2022-06-12 DIAGNOSIS — Z114 Encounter for screening for human immunodeficiency virus [HIV]: Secondary | ICD-10-CM

## 2022-06-12 NOTE — Patient Instructions (Addendum)
Eleccin del mtodo anticonceptivo Contraception Choices Los mtodos anticonceptivos son las cosas que usted hace o [place] para Location manager. Tambin se los denomina "anticoncepcin". Hay varios mtodos anticonceptivos. Hable con su mdico sobre el mejor mtodo para usted. Mtodos anticonceptivos hormonales Este tipo de mtodo anticonceptivo contiene hormonas. A continuacin se mencionan algunos tipos de mtodos anticonceptivos hormonales: Un tubo que se coloca debajo de la piel del brazo (implante). El tubo Insurance claims handler colocado durante 3 aos como mximo. Inyecciones que se deben aplicar cada 3 meses. Pldoras que se deben tomar [employer]. Un parche que se debe cambiar 1 vez por semana durante 3 semanas. Despus de [employer], el parche se debe retirar durante 1 semana. Un anillo que se coloca en la vagina. El anillo se deja colocado durante 3 semanas. Luego, se debe retirar de la vagina durante 1 semana. Despus se coloca un nuevo anillo en la vagina. Pldoras que se deben tomar despus de tener relaciones sexuales sin proteccin. Estas se denominan pldoras anticonceptivas de emergencia. Mtodos anticonceptivos de barrera A continuacin se mencionan algunos tipos de mtodos anticonceptivos de barrera: Una cubierta delgada que se coloca sobre el pene antes de tener sexo (condn masculino). La cubierta se desecha despus de eBay. Una cubierta blanda y suelta que se coloca en la vagina antes de tener sexo (condn femenino). La cubierta se desecha despus de eBay. Un dispositivo de goma que se aplica sobre el cuello uterino (diafragma). Este dispositivo debe fabricarse para usted. Se coloca en la vagina antes de tener sexo. Se debe dejar colocado durante 6 a 8 horas despus de eBay. Se debe retirar en un plazo de 24 horas. Un capuchn pequeo y [employer] se fija sobre el cuello uterino (capuchn cervical). Este capuchn debe fabricarse para usted. El capuchn se debe  dejar colocado durante 6 a 8 horas despus de Management consultant. Se debe retirar en un plazo de 48 horas. Una esponja que se coloca en la vagina antes de tener sexo. Se debe dejar colocada durante al menos 6 horas despus de eBay. Se debe retirar en un plazo de 30 horas y desecharse. Una sustancia qumica que destruye o impide que los espermatozoides ingresen al tero. Esa sustancia qumica se llama espermicida. Se puede presentar en forma de pldora, crema, gel o espuma y se debe colocar en la vagina. La sustancia qumica se debe usar al Lowe[employer] Companies de 10 a 15 minutos antes de eBay. Mtodos anticonceptivos con un DIU DIU significa "dispositivo intrauterino". Se coloca en el interior del tero. Existen dos tipos diferentes: DIU hormonal. Este tipo puede permanecer colocado en el tero durante 3 a 5 aos. DIU de cobre. Este tipo Insurance claims handler colocado en el tero durante 10 aos. Mtodo anticonceptivo permanente A continuacin se mencionan algunos tipos de mtodos anticonceptivos permanentes: [place] para obstruir las trompas de [place]. Colocacin de un dispositivo en cada una de las trompas de [place]. Este mtodo funciona al cabo de 3 meses. Se deben usar otros mtodos anticonceptivos durante 3 meses. Ciruga para atar los conductos que transportan los espermatozoides (vasectoma). Este mtodo funciona al cabo de 3 meses. Se deben usar otros mtodos anticonceptivos durante 3 meses. Mtodos anticonceptivos por planificacin natural A continuacin se mencionan algunos mtodos anticonceptivos por planificacin natural: No tener [employer] frtiles de la [place]. Usar un calendario a fin de: Insurance risk surveyor de la duracin de cada ciclo menstrual. Determinar en [employer] se podra producir Firefighter.  Planificar no tener [employer] en que se podra producir Firefighter. Reconocer los signos de la ovulacin y no tener relaciones sexuales durante ese perodo. Craig Staggers  en que la mujer puede detectar la ovulacin es controlarse la temperatura. Esperar para tener sexo hasta despus de la ovulacin. Dnde buscar ms informacin Centers for Disease Control and Prevention (Centros para el Control y Psychiatrist de Event organiser): [email or web address] Resumen La anticoncepcin, o los mtodos anticonceptivos, hace referencia a las cosas que usted hace o [place] para Location manager. Los mtodos anticonceptivos hormonales incluyen implantes, inyecciones, pldoras, parches, anillos vaginales y pldoras anticonceptivas de Associate Professor. Los mtodos anticonceptivos de barrera pueden incluir preservativos masculinos, preservativos femeninos, diafragmas, capuchones cervicales, esponjas y espermicidas. [employer] tipos diferentes de DIU (dispositivo intrauterino) anticonceptivo. Un DIU puede colocarse en el tero de una mujer para evitar el embarazo durante varios aos. Un mtodo anticonceptivo [employer] un procedimiento para hombres, mujeres o ambos. La planificacin familiar natural significa no tener sexo cuando la mujer podra quedar embarazada. Esta informacin no tiene Theme park manager el consejo del mdico. Asegrese de hacerle al mdico cualquier pregunta que tenga. Document Revised: 09/01/2019 Document Reviewed: 09/01/2019 Elsevier Patient Education  2023 Elsevier Inc. [place] de Papanicolaou Pap Test Por qu me debo realizar esta prueba? La prueba de Papanicolaou, tambin denominada citologa vaginal, es una prueba de cribado para detectar signos de: Infeccin. Cncer de cuello uterino. El cuello uterino es la parte baja del tero que se abre hacia la vagina. Cambios que podran ser un signo de que se est desarrollando un cncer (cambios precancerosos). Las mujeres deben realizarse esta prueba con regularidad. En general, debe hacerse una prueba de Papanicolaou cada 3 aos hasta alcanzar la menopausia o hasta los 65 aos. Las [employer] 30 y 60  aos de edad pueden elegir realizarse la prueba de Papanicolaou al mismo tiempo que la prueba del VPH (virus del papiloma humano) cada 5 aos (en lugar de cada 3 aos). El mdico puede recomendarle que se realice pruebas de Papanicolaou con ms o menos frecuencia en funcin de sus afecciones mdicas y los resultados de la prueba de Papanicolaou anterior. Qu se analiza? Las clulas cervicales se analizan para detectar signos de infeccin o anormalidades. Qu tipo de [place] se toma?  El mdico recolectar una muestra de clulas de la superficie del cuello uterino. Esto se har con un pequeo hisopo de algodn, una esptula de plstico o un cepillo que se inserta en la vagina con una herramienta llamada espculo. Con frecuencia, esta muestra se recolecta durante un examen plvico, mientras usted est recostada boca arriba sobre la camilla con los pies en los descansos para pies (estribos). En algunos casos, tambin pueden recolectarse fluidos (secreciones) del cuello uterino y la vagina. Cmo debo prepararme para esta prueba? Tenga en cuenta en qu etapa del ciclo menstrual se encuentra. Es posible que se le pida que vuelva a Charity fundraiser la prueba si est Magazine features editor en que debe Futures trader. Si el da en que debe realizarse la prueba tiene una infeccin vaginal aparente, deber volver a Nurse, learning disability prueba. Siga las instrucciones del mdico acerca de lo siguiente: Multimedia programmer o suspender los medicamentos que [place] habitualmente. Algunos medicamentos pueden [employer] de la prueba, como los medicamentos vaginales y Regulatory affairs officer. Evitar hacerse duchas vaginales 2 o 3 das antes o [address] de la prueba. Informe al mdico acerca de lo siguiente: Cualquier alergia que tenga. Todos los medicamentos que [place], incluidos  vitaminas, hierbas, gotas oftlmicas, cremas y [address] de [address]. Cualquier problema de la sangre que tenga. Cirugas a las que se haya sometido. Cualquier  afeccin mdica que tenga. Si est embarazada o podra estarlo. Cmo se informan los resultados? Los [employer] de la prueba se informarn como anormales o normales. Qu significan los resultados? Un resultado normal de la prueba significa que no tiene signos de cncer de cuello uterino. Un resultado anormal puede significar que tiene: Cncer. Una prueba de Papanicolaou por s sola no es suficiente para Consulting civil engineer. Se le realizarn ms pruebas si se sospecha la presencia de cncer. Cambios precancerosos en el cuello uterino. Inflamacin del cuello uterino. Una ITS (infeccin de transmisin sexual). Infecciones por hongos. Infecciones por parsitos. Hable con su mdico sobre lo que significan sus [place]. En algunos casos, el mdico puede hacer ms pruebas para [employer]. Preguntas para hacerle al mdico Consulte a su mdico o pregunte en el departamento donde se realiza la prueba acerca de lo siguiente: Cundo estarn disponibles mis resultados? Cmo obtendr mis resultados? Cules son las opciones de tratamiento? Qu otras pruebas necesito? Cules son los prximos pasos que debo seguir? Resumen En general, las mujeres deben hacerse una prueba de Papanicolaou cada 3 aos Engineer, maintenance la menopausia o [employer] 65 aos de [place]. El mdico recolectar una muestra de clulas de la superficie del cuello uterino. Lo har utilizando un pequeo hisopo de algodn, una esptula de plstico o un cepillo. En algunos casos, tambin pueden recolectarse fluidos (secreciones) del cuello uterino y la vagina. Esta informacin no tiene Theme park manager el consejo del mdico. Asegrese de hacerle al mdico cualquier pregunta que tenga. Document Revised: 05/21/2020 Document Reviewed: 05/21/2020 Elsevier Patient Education  2023 Ar[employer].

## 2022-06-12 NOTE — Progress Notes (Signed)
  [facility]  WELL-WOMAN PHYSICAL & PAP Patient name: Brenda Calderon MRN [id number]  Date of birth: 1995/09/26 Chief Complaint:   Annual Exam and Gynecologic Exam  History of Present Illness:   Brenda Calderon is a 27 y.o. No obstetric history on file. female being seen today for a routine well-woman exam.  Brenda Calderon [phone] interpreter  CC:gyn   The current method of family planning is condoms.  Patient's last menstrual period was 05/24/2022. Last pap none. Results were:   Family h/o breast cancer: Yes Family h/o colorectal cancer: No  Review of Systems:    Denies any headaches, blurred vision, fatigue, shortness of breath, chest pain, abdominal pain, abnormal vaginal discharge/itching/odor/irritation, problems with periods, bowel movements, urination, or intercourse unless otherwise stated above.  Pertinent History Reviewed:   Reviewed past medical,surgical, social and family history.  Reviewed problem list, medications and allergies.  Physical Assessment:   Vitals:   06/12/22 1104  BP: 114/76  Pulse: 64  Resp: 16  SpO2: 100%  Weight: 206 lb 3.2 oz (93.5 kg)  Height: 5\' 2"  (1.575 m)  Body mass index is 37.71 kg/m.        Physical Examination:  General appearance - well appearing, and in no distress Mental status - alert, oriented to person, place, and time Psych:  She has a normal mood and affect Skin - warm and dry, normal color, no suspicious lesions noted Chest - effort normal, all lung fields clear to auscultation bilaterally Heart - normal rate and regular rhythm Neck:  midline trachea, no thyromegaly or nodules Breasts - breasts appear normal, no suspicious masses, no skin or nipple changes or axillary nodes Educated patient on proper self breast examination and had patient to demonstrate SBE. Abdomen - soft, nontender, nondistended, no masses or organomegaly Pelvic-VULVA: normal appearing vulva with no masses, tenderness or  lesions   VAGINA: normal appearing vagina with normal color and discharge, no lesions   CERVIX: normal appearing cervix with discharge or lesions, no CMT UTERUS: uterus is felt to be normal size, shape, consistency and nontender  ADNEXA: No adnexal masses or tenderness noted. Extremities:  No swelling or varicosities noted  No results found for this or any previous visit (from the past 24 hour(s)).   Assessment & Plan:  Brenda Calderon was seen today for annual exam and gynecologic exam.  Diagnoses and all orders for this visit:  Cervical cancer screening -     Cytology - PAP -     Cervicovaginal ancillary only  Encounter for screening for HIV -     HIV Antibody (routine testing w rflx)  Encounter for HCV screening test for low risk patient -     HCV Ab w Reflex to Quant PCR  Birth control counseling  Provided options for birth control on AVS . Patient to call to schedule for birthcontrol   Follow-up: Patient to call to schedule for birthcontrol  This note has been created with Education officer, environmental. Any transcriptional errors are unintentional.   Grayce Sessions, NP 06/12/2022, 12:11 PM

## 2022-06-13 LAB — HCV INTERPRETATION

## 2022-06-13 LAB — HCV AB W REFLEX TO QUANT PCR: HCV Ab: NONREACTIVE

## 2022-06-13 LAB — HIV ANTIBODY (ROUTINE TESTING W REFLEX): HIV Screen 4th Generation wRfx: NONREACTIVE

## 2022-06-15 LAB — CERVICOVAGINAL ANCILLARY ONLY
Bacterial Vaginitis (gardnerella): POSITIVE — AB
Candida Glabrata: NEGATIVE
Candida Vaginitis: NEGATIVE
Chlamydia: NEGATIVE
Comment: NEGATIVE
Comment: NEGATIVE
Comment: NEGATIVE
Comment: NEGATIVE
Comment: NEGATIVE
Comment: NORMAL
Neisseria Gonorrhea: NEGATIVE
Trichomonas: NEGATIVE

## 2022-06-19 LAB — CYTOLOGY - PAP
Comment: NEGATIVE
Comment: NEGATIVE
Comment: NEGATIVE
Diagnosis: HIGH — AB
HPV 16: NEGATIVE
HPV 18 / 45: NEGATIVE
High risk HPV: POSITIVE — AB

## 2022-06-26 ENCOUNTER — Other Ambulatory Visit (INDEPENDENT_AMBULATORY_CARE_PROVIDER_SITE_OTHER): Payer: Self-pay | Admitting: Primary Care

## 2022-06-26 DIAGNOSIS — R87611 Atypical squamous cells cannot exclude high grade squamous intraepithelial lesion on cytologic smear of cervix (ASC-H): Secondary | ICD-10-CM

## 2022-09-11 ENCOUNTER — Encounter: Payer: Self-pay | Admitting: Family Medicine

## 2023-02-12 IMAGING — DX DG CHEST 2V
2 series · 2 of 2 positions shown · non-contrast
Comparison: None.

CLINICAL DATA: Cough for 1 month.

EXAM:
CHEST - 2 VIEW

[chest pa]
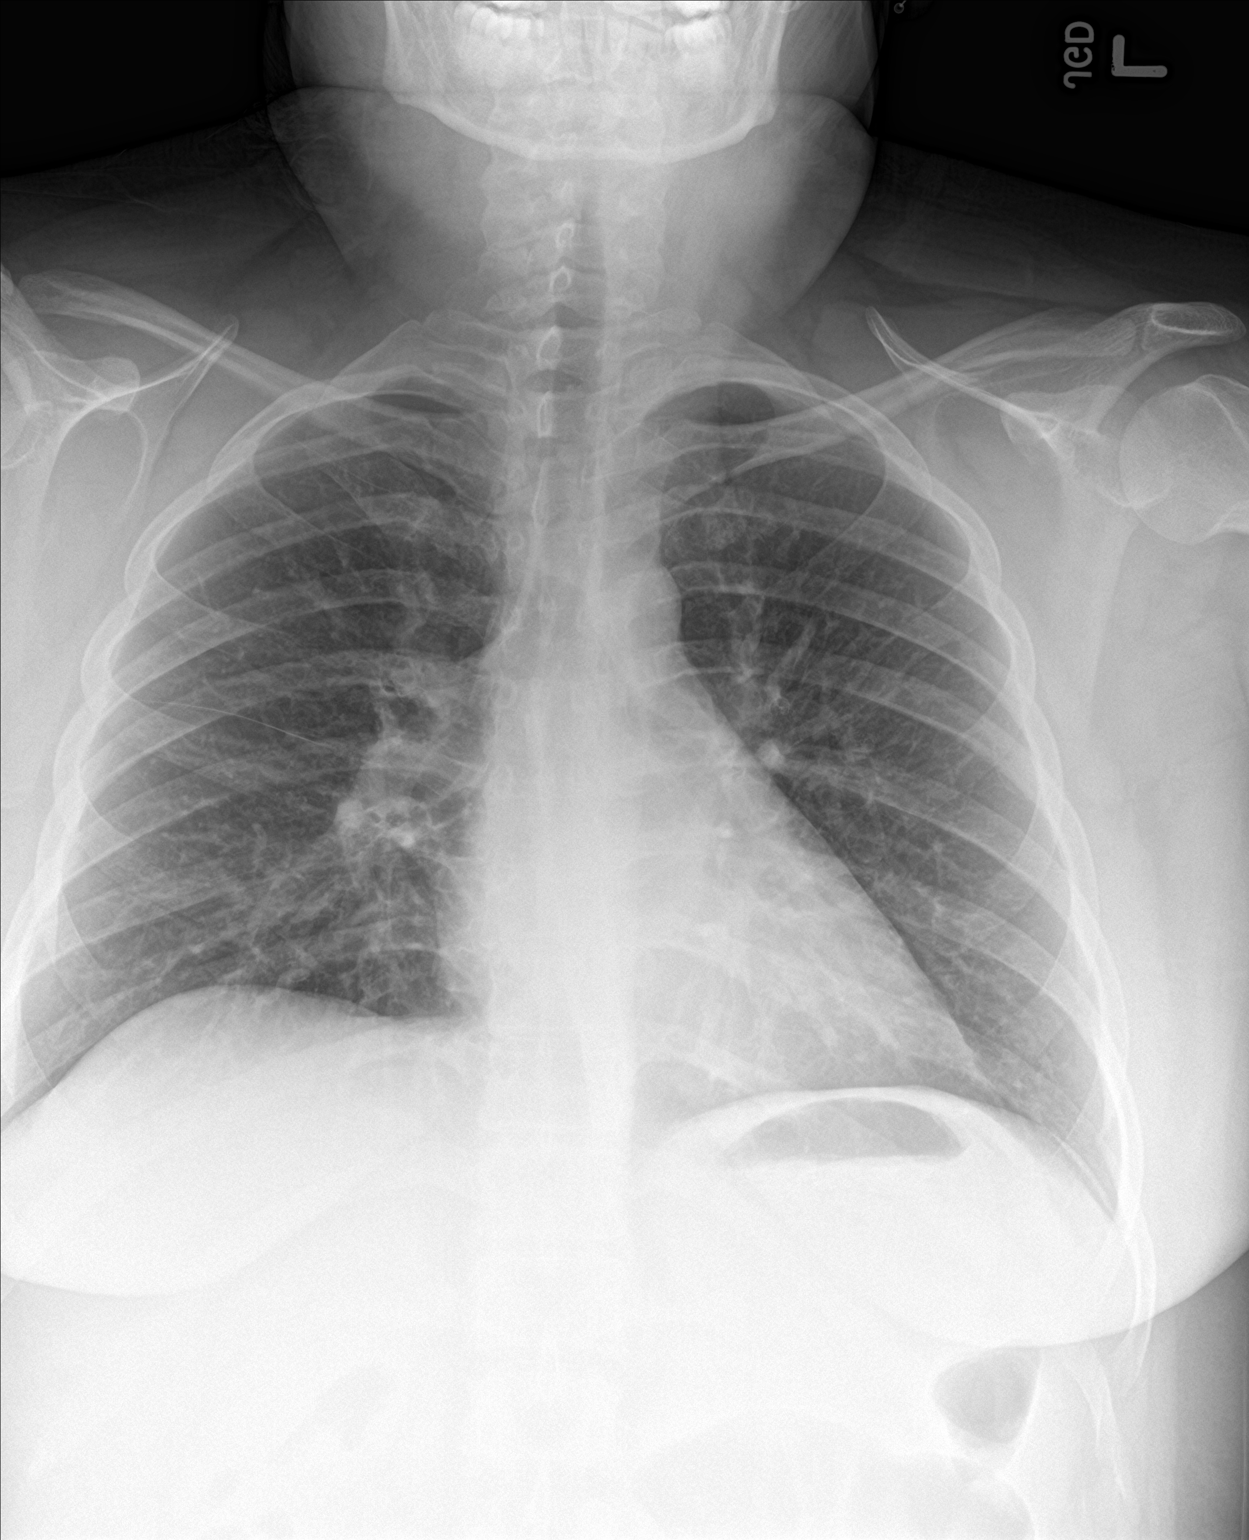

[chest lat]
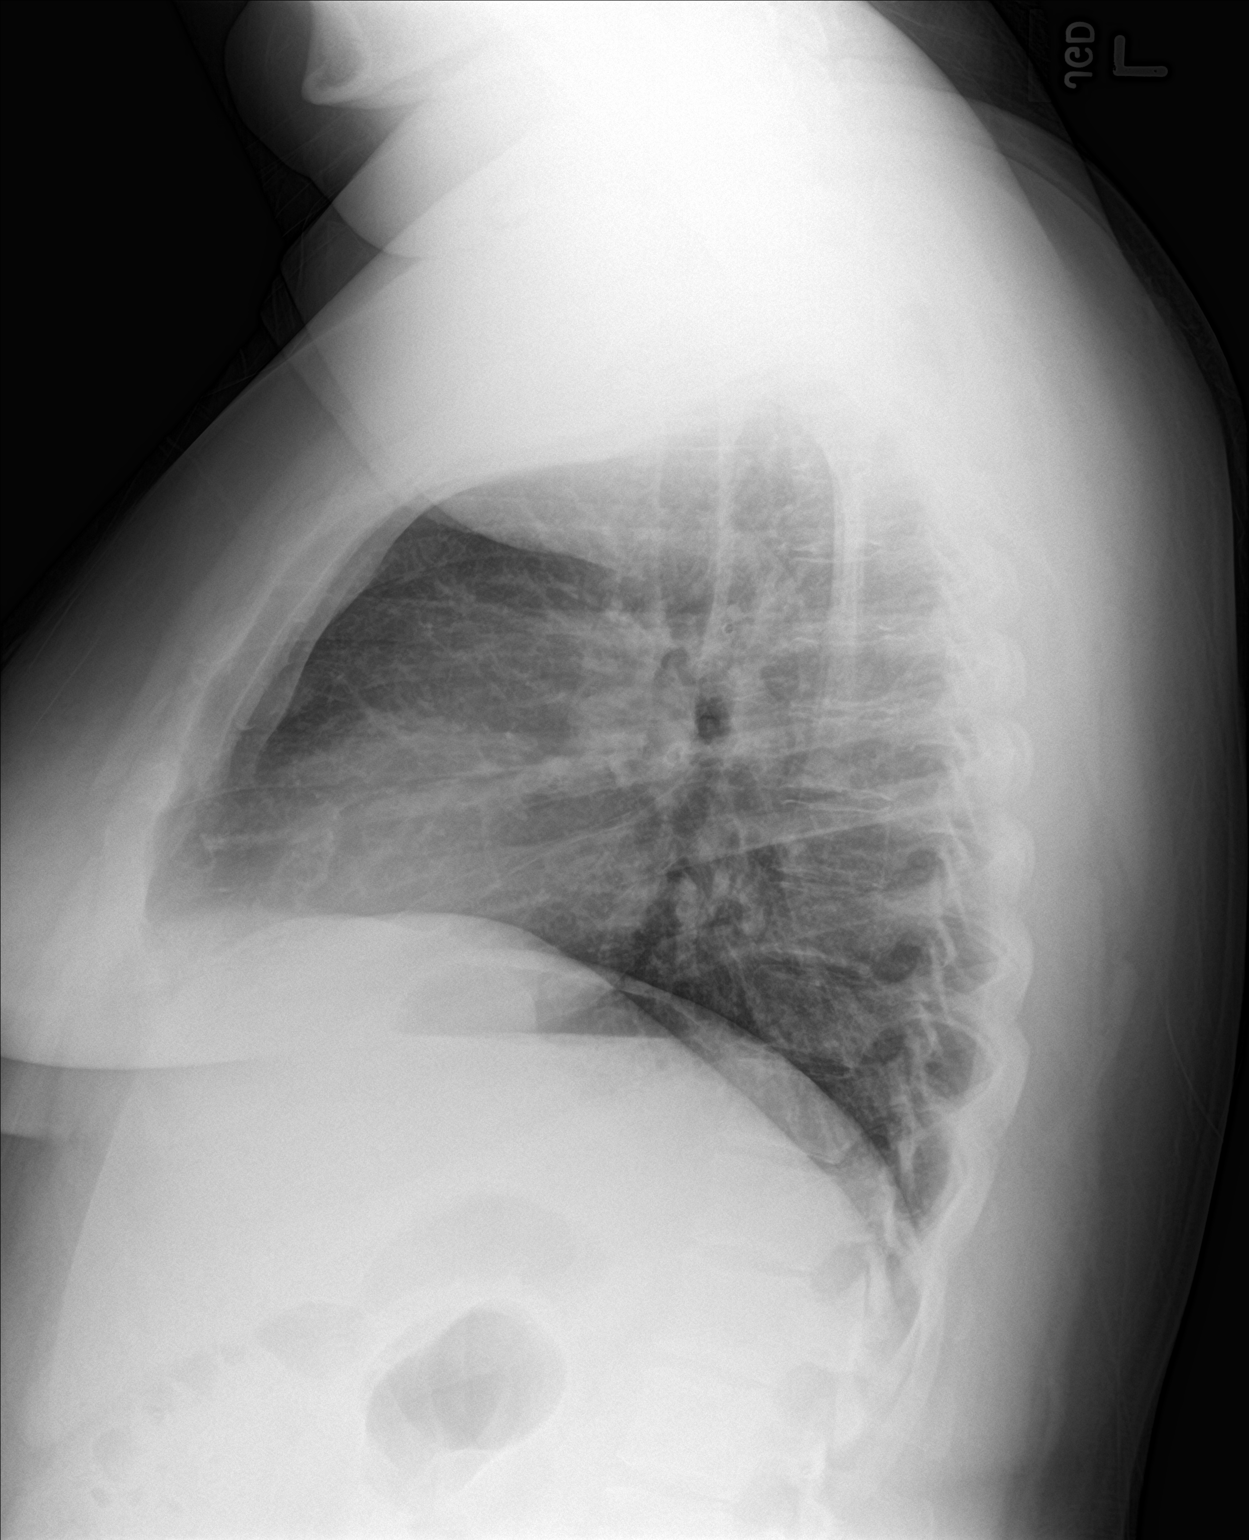

[2 of 2 positions shown; findings below may reference images not displayed]

FINDINGS: The heart size and mediastinal contours are within normal limits.
Both lungs are clear. The visualized skeletal structures are
unremarkable.
IMPRESSION: No active cardiopulmonary disease.

## 2023-06-12 ENCOUNTER — Encounter (INDEPENDENT_AMBULATORY_CARE_PROVIDER_SITE_OTHER): Payer: Self-pay | Admitting: Primary Care

## 2023-06-12 ENCOUNTER — Ambulatory Visit (INDEPENDENT_AMBULATORY_CARE_PROVIDER_SITE_OTHER): Payer: Self-pay | Admitting: Primary Care

## 2023-06-12 VITALS — BP 110/73 | HR 57 | Resp 16 | Ht 62.0 in | Wt 215.0 lb

## 2023-06-12 DIAGNOSIS — Z131 Encounter for screening for diabetes mellitus: Secondary | ICD-10-CM

## 2023-06-12 DIAGNOSIS — Z6839 Body mass index (BMI) 39.0-39.9, adult: Secondary | ICD-10-CM

## 2023-06-12 DIAGNOSIS — E669 Obesity, unspecified: Secondary | ICD-10-CM

## 2023-06-12 DIAGNOSIS — Z0000 Encounter for general adult medical examination without abnormal findings: Secondary | ICD-10-CM

## 2023-06-12 DIAGNOSIS — Z13220 Encounter for screening for lipoid disorders: Secondary | ICD-10-CM

## 2023-06-12 DIAGNOSIS — B351 Tinea unguium: Secondary | ICD-10-CM

## 2023-06-12 DIAGNOSIS — Z23 Encounter for immunization: Secondary | ICD-10-CM

## 2023-06-12 DIAGNOSIS — N921 Excessive and frequent menstruation with irregular cycle: Secondary | ICD-10-CM

## 2023-06-12 NOTE — Patient Instructions (Addendum)
 Obesidad en los adultos Obesity, Adult La obesidad es un exceso de Art gallery manager. Ser obeso significa que su peso es ms alto de lo que es saludable para usted.  El IMC (ndice de masa muscular) es un nmero que indica la cantidad de grasa corporal que tiene una persona. Si usted tiene un ndice de masa corporal (IMC) de 30 o ms, esto significa que es obeso. La obesidad puede causar problemas de salud graves, como los siguientes: Accidente cerebrovascular. Arteriopata coronaria (EAC). Diabetes tipo 2. Algunos tipos de cncer. Presin arterial alta (hipertensin arterial). Colesterol alto. Clculos en la vescula biliar. La obesidad tambin puede contribuir a lo siguiente: Artrosis. Apnea del sueo. Problemas de esterilidad. Cules son las causas? Consumir todos los das alimentos con altos niveles de caloras, azcar y grasa. Beber gran cantidad de bebidas con azcar. Nacer con genes que pueden hacerlo ms propenso a ser obeso. Tener una afeccin mdica que causa obesidad. Tomar ciertos medicamentos. Permanecer mucho tiempo sentado (tener un estilo de vida sedentario). No dormir lo suficiente. Qu incrementa el riesgo? Tener antecedentes familiares de obesidad. Vivir en un rea con acceso limitado a las siguientes posibilidades: Parques, centros recreativos o veredas. Alimentos saludables, como se venden en tiendas de comestibles y mercados de Event organiser. Cules son los signos o sntomas? El principal signo es tener demasiada grasa corporal. Cmo se trata? El tratamiento de esta afeccin frecuentemente incluye cambiar el estilo de vida. El tratamiento puede incluir: Cambios en la dieta. Esto puede incluir crear un plan de alimentacin saludable. Realizar actividad fsica. Puede incluir una actividad que hace que el corazn lata ms rpido (ejercicio [place]) y Fish farm manager de Pensions consultant. Trabaje con su mdico para disear un programa que funcione para usted. Medicamentos  para ayudarlo a Curator. Pueden utilizarse si no puede perder una libra por semana despus de 6 semanas de alimentacin saludable y ms ejercicio. Tratar las afecciones que causan la obesidad. Ciruga. Las opciones pueden incluir bandas gstricas y bypass gstrico. Esto puede realizarse en las siguientes situaciones: Otros tratamientos no mejoraron su afeccin. Tiene un IMC de 40 o superior. Tiene problemas de salud potencialmente mortales relacionados con la obesidad. Siga estas indicaciones en su casa: Comida y bebida  Siga las instrucciones del mdico respecto de las comidas y las bebidas. Su mdico puede recomendarle lo siguiente: Limitar las comidas rpidas, los dulces y las colaciones procesadas. Elegir opciones con bajo contenido de [place]. Por ejemplo, Counsellor de [employer]. Consumir cinco o ms porciones de frutas o verduras por da. Comer en casa con ms frecuencia. Esto le da ms control sobre lo que come. Elegir alimentos saludables cuando coma afuera. Aprender a leer las etiquetas de los alimentos. Esto le ayudar a aprender qu cantidad de alimento hay en una porcin. Tener a mano colaciones con bajo contenido de [place]. Evitar las bebidas que contengan mucha azcar. Estas incluyen refrescos, jugo de frutas, t helado con azcar y leche saborizada. Beba suficiente agua para mantener el pis (la [place]) de color amarillo plido. No siga las dietas de [place]. Actividad fsica Haga ejercicios con frecuencia, como se lo haya indicado el mdico. La mayora de los adultos deben hacer hasta 150 minutos de ejercicio de intensidad moderada cada semana.Pregntele al mdico lo siguiente: Los tipos de ejercicios que son seguros para usted. La frecuencia con la que [employer] ejercicios. Precaliente y elongue adecuadamente antes de hacer actividad fsica. Haga un estiramiento lento despus de la actividad (relajacin). Descanse entre los perodos de  actividad. Estilo de vida Trabaje con su mdico y con un experto en alimentacin (nutricionista) para establecer un objetivo de prdida de peso que sea adecuado para usted. Limite el tiempo que pasa frente a una pantalla. Busque formas de recompensarse que no incluyan alimentos. No beba alcohol si: El mdico le indica que no lo haga. Est embarazada, puede estar embarazada o est tratando de quedar embarazada. Si bebe alcohol: Limite la cantidad que bebe a lo siguiente: De 0 a 1 medida por da para las mujeres. De 0 a 2 medidas por da para los hombres. Sepa cunta cantidad de alcohol hay en las bebidas que toma. En los [address], una medida equivale a una botella de cerveza de 12 oz (355 ml), un vaso de vino de 5 oz (148 ml) o un vaso de una bebida alcohlica de alta graduacin de 1 oz (44 ml). Indicaciones generales Lleve un diario de su prdida de peso. Esto puede ayudarlo a Youth worker de lo siguiente: Los alimentos que come. Cunto ejercicio realiza. Use los medicamentos de venta libre y los recetados solamente como se lo haya indicado el mdico. Tome vitaminas y suplementos solamente como se lo haya indicado el mdico. Considere participar en un grupo de apoyo. Preste atencin a Radiographer, therapeutic mental, ya que la obesidad puede provocar depresin o problemas de [place]. Concurra a todas las visitas de seguimiento. Comunquese con un mdico si: No puede alcanzar su objetivo de prdida de peso despus de haber modificado su dieta y su estilo de vida durante 6 semanas. Presenta dificultades respiratorias sbitas. Resumen La obesidad es un exceso de Art gallery manager. Ser obeso significa que su peso es ms alto de lo que es saludable para usted. Trabaje con su mdico para establecer un objetivo de prdida de [place]. Haga actividad fsica con regularidad tal como le indic el mdico. Esta informacin no tiene Theme park manager el consejo del mdico. Asegrese de hacerle al  mdico cualquier pregunta que tenga. Document Revised: 09/29/2020 Document Reviewed: 09/29/2020 Elsevier Patient Education  2024 Elsevier Inc.Opciones de mtodos anticonceptivos Birth Control Options Los mtodos anticonceptivos tambin se denominan anticonceptivos. Los anticonceptivos previenen el embarazo. Hay muchos tipos de anticonceptivos. Trabaje con el mdico para encontrar la opcin ms adecuada para usted. Anticonceptivos que utilizan hormonas Estos tipos de anticonceptivos contienen hormonas para prevenir el embarazo. Implante anticonceptivo Este es un pequeo tubo que se coloca dentro de la piel del brazo. El tubo Insurance claims handler colocado durante 3 aos como mximo. Inyeccin anticonceptiva Son inyecciones que se aplican cada 3 meses. Pldoras anticonceptivas Esta es una pldora que se toma todos [place]. Debe tomarla a la [employer]. Parche anticonceptivo Este es un parche que se coloca sobre la piel. Se debe cambiar 1 vez por semana durante 3 semanas. Despus de [employer]O, el parche se debe retirar durante 1 semana. Anillo vaginal  Este es un anillo de plstico blando que se coloca dentro de la vagina. El anillo se deja colocado durante 3 semanas. Luego, se debe retirar durante 1 semana. Despus se coloca un nuevo anillo. Mtodos de barrera  Preservativo masculino Es una cubierta delgada que se coloca sobre el pene antes de [place]. El preservativo se desecha despus de Doctor, hospital. Preservativo femenino Es una cubierta blanda y suelta que se coloca en la vagina antes de [place]. El preservativo se desecha despus de Doctor, hospital. Diafragma El diafragma es una barrera blanda con forma de tazn. Debe estar hecho para adaptarse  a su cuerpo. Se coloca en la vagina antes de tener sexo con una sustancia qumica que destruye los espermatozoides llamada espermicida. El Designer, fashion/clothing en la vagina durante 6 a 8 horas despus de tener sexo y debe  retirarse en un plazo de 24 horas. El diafragma se debe reemplazar: Cada 1 o 2 aos. Despus de dar a luz. Despus de aumentar ms de 15 libras (6.8 kg). Si se somete a una ciruga en la pelvis. Capuchn cervical Este es un capuchn pequeo y [place] se fija sobre el cuello uterino. El cuello uterino es la parte ms baja del tero. Se coloca en la vagina antes del sexo, junto con un espermicida. El capuchn debe fabricarse para usted. El capuchn se debe dejar colocado durante 6 a 8 horas despus del sexo. Se debe retirar en un plazo de 48 horas. El capuchn cervical debe ser recetado y adaptado a su cuerpo por un mdico. Debe reemplazarse cada 2 aos. Esponja Esta es una esponja pequea que se coloca en la vagina antes de [place]. Se debe dejar colocada durante al menos 6 horas despus de eBay. Se debe retirar en un plazo de 30 horas y desecharse. Espermicidas Son sustancias qumicas que destruyen o impiden que los espermatozoides ingresen al tero. Se pueden presentar en forma de pldora, crema, gel o espuma que se debe colocar en la vagina. Se deben usar al menos de 10 a 15 minutos antes de eBay. Dispositivo intrauterino Un dispositivo intrauterino (DIU) es un dispositivo que un mdico coloca dentro del tero. Existen dos tipos: DIU hormonal. Este tipo puede permanecer colocado durante 3 a 5 aos. DIU de cobre. Este tipo puede permanecer colocado durante 10 aos. Mtodos anticonceptivos permanentes Ligadura de trompas en la mujer Es una ciruga para obstruir las trompas de [place]. Esterilizacin masculina Es una ciruga, llamada vasectoma, para ligar los conductos que transportan los espermatozoides en los hombres. Este mtodo funciona al cabo de 3 meses. Se deben usar otros mtodos anticonceptivos durante 3 meses. Mtodos de planificacin natural Esto significa no tener [employer] la pareja femenina podra quedar embarazada. A continuacin se mencionan algunos  mtodos anticonceptivos por planificacin natural: Usar un calendario a fin de: Hacer un seguimiento de la duracin de cada ciclo menstrual. Determinar en [employer] se podra producir el embarazo. Planificar no tener [employer] en que se podra producir Firefighter. Reconocer los signos de la ovulacin y no tener relaciones sexuales durante ese perodo. La pareja femenina puede detectar cundo ser la ovulacin haciendo un seguimiento de su temperatura todos [place]. Tambin puede examinar si hay cambios en la mucosidad que proviene del cuello uterino. Dnde obtener ms informacin Centers for Disease Control and Prevention (Centros para el Control y la Prevencin de Enfermedades): [email or web address]. Luego: Introduzca "birth control" o "anticonceptivos" en el cuadro de bsqueda. Esta informacin no tiene Theme park manager el consejo del mdico. Asegrese de hacerle al mdico cualquier pregunta que tenga. Document Revised: 10/05/2022 Document Reviewed: 10/05/2022 Elsevier Patient Education  2024 [employer].

## 2023-06-12 NOTE — Progress Notes (Signed)
 [facility]  Brenda Calderon is a 28 y.o. obese Hispanic obese female presents to office today for annual physical exam examination.[employer] [phone]).  Patient is sexually she uses condoms at times and luck at times explained this equals baby she will return after reading information about birth control when she decides that something she wants to do.  Concerns today include: 1. Right toe discoloration   Occupation: Conservation officer, nature, Marital status: S, Substance use: N Diet: No, Exercise: N  Health Maintenance  Topic Date Due   COVID-19 Vaccine (1 - 2024-25 season) Never done   INFLUENZA VACCINE  09/13/2023   Cervical Cancer Screening (Pap smear)  06/11/2025   DTaP/Tdap/Td (2 - Td or Tdap) 06/11/2033   Hepatitis C Screening  Completed   HIV Screening  Completed   HPV VACCINES  Aged Out   Meningococcal B Vaccine  Aged Out     No past medical history on file. Social History   Socioeconomic History   Marital status: Single    Spouse name: Not on file   Number of children: Not on file   Years of education: Not on file   Highest education level: Not on file  Occupational History   Not on file  Tobacco Use   Smoking status: Never   Smokeless tobacco: Never  Substance and Sexual Activity   Alcohol use: Not on file   Drug use: Never   Sexual activity: Never    Partners: Male  Other Topics Concern   Not on file  Social History Narrative   Not on file   Social Drivers of Health   Financial Resource Strain: Not on file  Food Insecurity: Not on file  Transportation Needs: Not on file  Physical Activity: Not on file  Stress: Not on file  Social Connections: Not on file  Intimate Partner Violence: Not on file   No past surgical history on file. Family History  Problem Relation Age of Onset   Healthy Mother    Healthy Father     Current Outpatient Medications:    metoprolol  succinate (TOPROL -XL) 25 MG 24 hr tablet, Take 1 tab by mouth  daily (Patient not taking: Reported on 06/12/2023), Disp: 90 tablet, Rfl: 2 Outpatient Encounter Medications as of 06/12/2023  Medication Sig   metoprolol  succinate (TOPROL -XL) 25 MG 24 hr tablet Take 1 tab by mouth daily (Patient not taking: Reported on 06/12/2023)   No facility-administered encounter medications on file as of 06/12/2023.    Allergies  Allergen Reactions   Shrimp (Diagnostic) Hives     ROS: Review of Systems Pertinent items noted in HPI and remainder of comprehensive ROS otherwise negative.    Physical exam: General: Vital signs reviewed.  Patient is well-developed and well-nourished, obes in no acute distress and cooperative with exam. Head: Normocephalic and atraumatic. Eyes: EOMI, conjunctivae normal, no scleral icterus. Neck: Supple, trachea midline, normal ROM, no JVD, masses, thyromegaly, or carotid bruit present. Cardiovascular: RRR, S1 normal, S2 normal, no murmurs, gallops, or rubs. Pulmonary/Chest: Clear to auscultation bilaterally, no wheezes, rales, or rhonchi. Abdominal: Soft, non-tender, non-distended, BS +, no masses, organomegaly, or guarding present. Musculoskeletal: No joint deformities, erythema, or stiffness, ROM full and nontender. Extremities: No lower extremity edema bilaterally,  pulses symmetric and intact bilaterally. No cyanosis or clubbing. Neurological: A&O x3, Strength is normal Skin: Warm, dry and intact. No rashes or erythema. Psychiatric: Normal mood and affect. speech and behavior is normal. Cognition and memory are normal.  Assessment/ Plan: Brenda Calderon  was seen today for annual exam and nail problem.  Diagnoses and all orders for this visit:  Encounter for immunization -     Tdap vaccine greater than or equal to 7yo IM  Onychomycosis Over-the-counter fungal medication ointments, drips or creams Interpreter spelled in [place] and Spanish so she can purchase it the cheapest one  Menorrhagia with irregular cycle Last patient when  her work tenderness or was the indication of menstrual cycle about to come on at that time she can take 400 mg of ibuprofen every 8 hours before doing and stop afterwards.  This will decrease pain, blood flow from menstrual cycle, and can shorten the time of the cycle. -     CBC with Differential/Platelet  Obesity with body mass index (BMI) of 30.0 to 39.9 Discussed the comorbidities associated with obesity she will participate in a less than 10-minute exercising daily jumping jacks and squats and monitor carbohydrates -     TSH + free T4  Diabetes mellitus screening -     Hemoglobin A1c  Screening, lipid -     Lipid panel  Annual wellness visit -     [id number]+EGFR    Counseled on healthy lifestyle choices, including diet (rich in fruits, vegetables and lean meats and low in salt and simple carbohydrates) and exercise (at least 30 minutes of moderate physical activity daily).  Patient to follow up in 1 year for annual exam or sooner if needed.  The above assessment and management plan was discussed with the patient. The patient verbalized understanding of and has agreed to the management plan. Patient is aware to call the clinic if symptoms persist or worsen. Patient is aware when to return to the clinic for a follow-up visit. Patient educated on when it is appropriate to go to the emergency department.   This note has been created with Education officer, environmental. Any transcriptional errors are unintentional.   Marius Siemens, NP 06/12/2023, 9:01 AM

## 2023-06-14 LAB — CBC WITH DIFFERENTIAL/PLATELET
Basophils Absolute: 0.1 10*3/uL (ref 0.0–0.2)
Basos: 1 %
EOS (ABSOLUTE): 0.2 10*3/uL (ref 0.0–0.4)
Eos: 2 %
Hematocrit: 45.8 % (ref 34.0–46.6)
Hemoglobin: 14 g/dL (ref 11.1–15.9)
Immature Grans (Abs): 0 10*3/uL (ref 0.0–0.1)
Immature Granulocytes: 0 %
Lymphocytes Absolute: 2.6 10*3/uL (ref 0.7–3.1)
Lymphs: 33 %
MCH: 28.1 pg (ref 26.6–33.0)
MCHC: 30.6 g/dL — ABNORMAL LOW (ref 31.5–35.7)
MCV: 92 fL (ref 79–97)
Monocytes Absolute: 0.6 10*3/uL (ref 0.1–0.9)
Monocytes: 8 %
Neutrophils Absolute: 4.5 10*3/uL (ref 1.4–7.0)
Neutrophils: 56 %
Platelets: 250 10*3/uL (ref 150–450)
RBC: 4.99 x10E6/uL (ref 3.77–5.28)
RDW: 13.5 % (ref 11.7–15.4)
WBC: 7.9 10*3/uL (ref 3.4–10.8)

## 2023-06-14 LAB — CMP14+EGFR
ALT: 105 IU/L — ABNORMAL HIGH (ref 0–32)
AST: 67 IU/L — ABNORMAL HIGH (ref 0–40)
Albumin: 4.4 g/dL (ref 4.0–5.0)
Alkaline Phosphatase: 113 IU/L (ref 44–121)
BUN/Creatinine Ratio: 18 (ref 9–23)
BUN: 12 mg/dL (ref 6–20)
Bilirubin Total: 0.5 mg/dL (ref 0.0–1.2)
CO2: 24 mmol/L (ref 20–29)
Calcium: 9 mg/dL (ref 8.7–10.2)
Chloride: 102 mmol/L (ref 96–106)
Creatinine, Ser: 0.66 mg/dL (ref 0.57–1.00)
Globulin, Total: 3.1 g/dL (ref 1.5–4.5)
Glucose: 88 mg/dL (ref 70–99)
Potassium: 4.2 mmol/L (ref 3.5–5.2)
Sodium: 141 mmol/L (ref 134–144)
Total Protein: 7.5 g/dL (ref 6.0–8.5)
eGFR: 123 mL/min/{1.73_m2} (ref >59)

## 2023-06-14 LAB — LIPID PANEL
Chol/HDL Ratio: 4.9 ratio — ABNORMAL HIGH (ref 0.0–4.4)
Cholesterol, Total: 188 mg/dL (ref 100–199)
HDL: 38 mg/dL — ABNORMAL LOW (ref >39)
LDL Chol Calc (NIH): 123 mg/dL — ABNORMAL HIGH (ref 0–99)
Triglycerides: 151 mg/dL — ABNORMAL HIGH (ref 0–149)
VLDL Cholesterol Cal: 27 mg/dL (ref 5–40)

## 2023-06-14 LAB — TSH+FREE T4
Free T4: 1.19 ng/dL (ref 0.82–1.77)
TSH: 1.66 u[IU]/mL (ref 0.450–4.500)

## 2023-06-14 LAB — HEMOGLOBIN A1C
Est. average glucose Bld gHb Est-mCnc: 126 mg/dL
Hgb A1c MFr Bld: 6 % — ABNORMAL HIGH (ref 4.8–5.6)

## 2023-06-17 ENCOUNTER — Encounter: Payer: Self-pay | Admitting: Nurse Practitioner

## 2023-09-23 ENCOUNTER — Ambulatory Visit (INDEPENDENT_AMBULATORY_CARE_PROVIDER_SITE_OTHER): Payer: Self-pay | Admitting: Primary Care

## 2023-09-23 ENCOUNTER — Other Ambulatory Visit: Payer: Self-pay

## 2023-09-23 ENCOUNTER — Encounter (INDEPENDENT_AMBULATORY_CARE_PROVIDER_SITE_OTHER): Payer: Self-pay | Admitting: Primary Care

## 2023-09-23 VITALS — BP 103/70 | HR 77 | Wt 216.8 lb

## 2023-09-23 DIAGNOSIS — R0981 Nasal congestion: Secondary | ICD-10-CM

## 2023-09-23 DIAGNOSIS — E782 Mixed hyperlipidemia: Secondary | ICD-10-CM

## 2023-09-23 DIAGNOSIS — R350 Frequency of micturition: Secondary | ICD-10-CM

## 2023-09-23 DIAGNOSIS — R051 Acute cough: Secondary | ICD-10-CM

## 2023-09-23 DIAGNOSIS — R1030 Lower abdominal pain, unspecified: Secondary | ICD-10-CM

## 2023-09-23 DIAGNOSIS — N921 Excessive and frequent menstruation with irregular cycle: Secondary | ICD-10-CM

## 2023-09-23 LAB — POCT URINALYSIS DIP (CLINITEK)
Bilirubin, UA: NEGATIVE
Glucose, UA: NEGATIVE mg/dL
Ketones, POC UA: NEGATIVE mg/dL
Nitrite, UA: NEGATIVE
Spec Grav, UA: 1.02 (ref 1.010–1.025)
Urobilinogen, UA: 0.2 U/dL
pH, UA: 6 (ref 5.0–8.0)

## 2023-09-23 LAB — POCT RAPID STREP A (OFFICE): Rapid Strep A Screen: NEGATIVE

## 2023-09-23 MED ORDER — FLUTICASONE PROPIONATE 50 MCG/ACT NA SUSP
2.0000 | Freq: Every day | NASAL | 2 refills | Status: DC
  Filled 2023-09-23: qty 16, 30d supply, fill #0

## 2023-09-23 NOTE — Progress Notes (Signed)
 [facility]  [facility] Brenda Calderon, is a 28 y.o. female  RDW:[id number]  FMW:[id number]  DOB - [birth year]  Chief Complaint  Patient presents with   Medical Management of Chronic Issues   Abdominal Pain    Lower- had for weeks, no urinary problems        Subjective:   Brenda Calderon is a 28 y.o. 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  Patient is complaining of abdominal pain lower quadrant for several weeks she denies dysuria. Menorrhagia increasing abdominal pains- 5 days irregular . No birthcontrol condom only. Abdominal Pain This is a recurrent problem. The current episode started more than 1 month ago. The onset quality is gradual. The problem occurs intermittently. The most recent episode lasted 2 weeks. The problem has been gradually worsening. The pain is located in the LLQ and RLQ. The pain is at a severity of 6/10. The pain is moderate. The quality of the pain is dull (throbbing). The abdominal pain does not radiate. Associated symptoms include headaches and hematochezia. The pain is aggravated by coughing. The pain is relieved by Sitting up. She has tried nothing for the symptoms.  Cough This is a new problem. The current episode started in the past 7 days. The problem has been waxing and waning. The problem occurs every few minutes. The cough is Productive of sputum. Associated symptoms include chills, headaches, nasal congestion and postnasal drip. The symptoms are aggravated by pollens (warm air). She has tried nothing for the symptoms.     No problems updated.  Comprehensive ROS Pertinent positive and negative noted in HPI   Allergies  Allergen Reactions   Shrimp (Diagnostic) Hives    History reviewed. No pertinent past medical history.  Current Outpatient Medications on File Prior to Visit  Medication Sig Dispense Refill   metoprolol  succinate (TOPROL -XL) 25 MG 24 hr tablet Take 1 tab by mouth  daily (Patient not taking: Reported on 09/23/2023) 90 tablet 2   No current facility-administered medications on file prior to visit.   Health Maintenance  Topic Date Due   Hepatitis B Vaccine (1 of 3 - 19+ 3-dose series) Never done   COVID-19 Vaccine (1 - 2024-25 season) Never done   HPV Vaccine (1 - 3-dose SCDM series) Never done   Flu Shot  09/13/2023   Pap Smear  06/11/2025   DTaP/Tdap/Td vaccine (2 - Td or Tdap) 06/11/2033   Hepatitis C Screening  Completed   HIV Screening  Completed   Meningitis B Vaccine  Aged Out    Objective:   Vitals:   09/23/23 0849  BP: 103/70  Pulse: 77  SpO2: 100%  Weight: 216 lb 12.8 oz (98.3 kg)   BP Readings from Last 3 Encounters:  09/23/23 103/70  06/12/23 110/73  06/12/22 114/76      Physical Exam Vitals reviewed.  Constitutional:      Appearance: She is obese.  HENT:     Head: Normocephalic.     Comments:  red swollen boggy turbinates right greater than left Tonsils swollen bilateral tender cervical chain    Mouth/Throat:     Pharynx: Pharyngeal swelling present.  Eyes:     Pupils: Pupils are equal, round, and reactive to light.  Cardiovascular:     Rate and Rhythm: Normal rate and regular rhythm.  Pulmonary:     Effort: Pulmonary effort is normal.     Breath sounds: Normal breath sounds.  Abdominal:     General:  Abdomen is proturant. Bowel sounds are normal.     Palpations: Abdomen is soft.     Tenderness: There is generalized abdominal tenderness.  Skin:    General: Skin is warm and dry.  Neurological:     Mental Status: She is alert and oriented to person, place, and time.  Psychiatric:        Mood and Affect: Mood normal.        Behavior: Behavior normal.       Assessment & Plan  [name] was seen today for medical management of chronic issues and abdominal pain.  Diagnoses and all orders for this visit:  Lower abdominal pain 2/2 Frequent urination  Urinalysis   Menorrhagia with irregular cycle  heavy   vaginal bleeding with menstrual cycles.  This may  related to hormonal problems, problems with the uterus or other health conditions.  Menstrual cycles can  normal or irregular.   Acute cough Tonsils red swollen  -     POCT rapid strep A  Mixed hyperlipidemia  Healthy lifestyle diet of fruits vegetables fish nuts whole grains and low saturated fat . Foods high in cholesterol or liver, fatty meats,cheese, butter avocados, nuts and seeds, chocolate and fried foods.  -     Lipid Panel  Nasal congestion Fluticasone  (FLONASE ) 50 MCG/ACT nasal spray    Patient have en counseled extensively about nutrition and exercise. Other issues discussed during this visit include: low cholesterol diet, weight control and daily exercise, foot care, annual eye examinations at Ophthalmology, importance of adherence with medications and regular follow-up. We also discussed long term complications of uncontrolled diates and hypertension.   Return in about 6 months (around 03/25/2024).  The patient was given clear instructions to go to ER or return to medical center if symptoms don't improve, worsen or new problems develop. The patient verbalized understanding. The patient was told to call to get lab results if they haven't heard anything in the next week.   This note has en created with Education officer, environmental. Any transcriptional errors are unintentional.   [name], NP 09/23/2023, 5:08 PM

## 2023-09-27 ENCOUNTER — Other Ambulatory Visit: Payer: Self-pay

## 2023-09-27 ENCOUNTER — Ambulatory Visit (INDEPENDENT_AMBULATORY_CARE_PROVIDER_SITE_OTHER): Payer: Self-pay | Admitting: Primary Care

## 2023-09-27 DIAGNOSIS — N3001 Acute cystitis with hematuria: Secondary | ICD-10-CM

## 2023-09-27 MED ORDER — SULFAMETHOXAZOLE-TRIMETHOPRIM 800-160 MG PO TABS
1.0000 | ORAL_TABLET | Freq: Two times a day (BID) | ORAL | 0 refills | Status: DC
  Filled 2023-09-27: qty 14, 7d supply, fill #0

## 2023-09-27 MED ORDER — FLUCONAZOLE 150 MG PO TABS
150.0000 mg | ORAL_TABLET | Freq: Every day | ORAL | 1 refills | Status: DC
  Filled 2023-09-27: qty 1, 1d supply, fill #0

## 2023-10-02 ENCOUNTER — Other Ambulatory Visit: Payer: Self-pay

## 2023-10-03 ENCOUNTER — Other Ambulatory Visit: Payer: Self-pay

## 2023-10-04 ENCOUNTER — Other Ambulatory Visit: Payer: Self-pay

## 2023-12-24 ENCOUNTER — Ambulatory Visit (INDEPENDENT_AMBULATORY_CARE_PROVIDER_SITE_OTHER): Payer: Self-pay | Admitting: Primary Care

## 2024-01-20 ENCOUNTER — Ambulatory Visit (INDEPENDENT_AMBULATORY_CARE_PROVIDER_SITE_OTHER): Payer: Self-pay | Admitting: Primary Care

## 2024-01-20 ENCOUNTER — Encounter (INDEPENDENT_AMBULATORY_CARE_PROVIDER_SITE_OTHER): Payer: Self-pay | Admitting: Primary Care

## 2024-01-20 VITALS — BP 116/77 | HR 104 | Resp 16

## 2024-01-20 DIAGNOSIS — J302 Other seasonal allergic rhinitis: Secondary | ICD-10-CM

## 2024-01-20 DIAGNOSIS — R0981 Nasal congestion: Secondary | ICD-10-CM

## 2024-01-20 DIAGNOSIS — R051 Acute cough: Secondary | ICD-10-CM

## 2024-01-25 ENCOUNTER — Other Ambulatory Visit: Payer: Self-pay

## 2024-01-25 ENCOUNTER — Inpatient Hospital Stay (HOSPITAL_COMMUNITY)
Admission: RE | Admit: 2024-01-25 | Discharge: 2024-01-25 | Payer: Self-pay | Attending: Emergency Medicine | Admitting: Emergency Medicine

## 2024-01-25 ENCOUNTER — Encounter (HOSPITAL_COMMUNITY): Payer: Self-pay

## 2024-01-25 VITALS — BP 114/75 | HR 93 | Temp 98.1°F | Resp 22

## 2024-01-25 DIAGNOSIS — N926 Irregular menstruation, unspecified: Secondary | ICD-10-CM

## 2024-01-25 DIAGNOSIS — R6889 Other general symptoms and signs: Secondary | ICD-10-CM

## 2024-01-25 DIAGNOSIS — Z3202 Encounter for pregnancy test, result negative: Secondary | ICD-10-CM

## 2024-01-25 DIAGNOSIS — H66003 Acute suppurative otitis media without spontaneous rupture of ear drum, bilateral: Secondary | ICD-10-CM

## 2024-01-25 DIAGNOSIS — J4521 Mild intermittent asthma with (acute) exacerbation: Secondary | ICD-10-CM

## 2024-01-25 LAB — POCT URINE PREGNANCY: Preg Test, Ur: NEGATIVE

## 2024-01-25 LAB — POC COVID19/FLU A&B COMBO
Covid Antigen, POC: NEGATIVE
Influenza A Antigen, POC: NEGATIVE
Influenza B Antigen, POC: NEGATIVE

## 2024-01-25 MED ORDER — AMOXICILLIN-POT CLAVULANATE 875-125 MG PO TABS: 1.0000 | ORAL_TABLET | Freq: Two times a day (BID) | ORAL | 0 refills | Status: AC

## 2024-01-25 MED ORDER — IPRATROPIUM-ALBUTEROL 0.5-2.5 (3) MG/3ML IN SOLN
RESPIRATORY_TRACT | Status: AC
  Filled 2024-01-25: qty 3

## 2024-01-25 MED ORDER — AEROCHAMBER PLUS FLO-VU MEDIUM MISC: 1.0000 | Freq: Once | 0 refills | Status: AC

## 2024-01-25 MED ORDER — ALBUTEROL SULFATE HFA 108 (90 BASE) MCG/ACT IN AERS: 2.0000 | INHALATION_SPRAY | Freq: Four times a day (QID) | RESPIRATORY_TRACT | 2 refills | Status: AC | PRN

## 2024-01-25 MED ORDER — IPRATROPIUM-ALBUTEROL 0.5-2.5 (3) MG/3ML IN SOLN
3.0000 mL | Freq: Once | RESPIRATORY_TRACT | Status: AC
  Administered 2024-01-25: 3 mL via RESPIRATORY_TRACT

## 2024-01-25 NOTE — ED Provider Notes (Signed)
 [facility]    CSN: [id number] Arrival date & time: 01/25/24  1009    HISTORY   Chief Complaint  Patient presents with   Cough   Otalgia    Appt 1030   HPI Brenda Calderon is a pleasant, 28 y.o. female who presents to urgent care today. Patient complains of a 5-day history of nonproductive cough, scratchy throat, sensation of clogged right ear and right ear pain.  Patient states she has not had a fever, shortness of breath, headache, nausea, vomiting, body aches, chills.  States she has been taking Robitussin without relief.  Patient has decreased SpO2 and elevated respirations on arrival with otherwise normal vital signs.  Patient denies history of allergies or asthma.  The history is provided by the patient. A language interpreter was used.  Cough Associated symptoms: ear pain   Otalgia Associated symptoms: cough    History reviewed. No pertinent past medical history. There are no active problems to display for this patient.  History reviewed. No pertinent surgical history. OB History   No obstetric history on file.    Home Medications    Prior to Admission medications  Medication Sig Start Date End Date Taking? Authorizing Provider  fluconazole  (DIFLUCAN ) 150 MG tablet Take 1 tablet (150 mg total) by mouth daily. 09/27/23   Celestia Rosaline SQUIBB, NP  metoprolol  succinate (TOPROL -XL) 25 MG 24 hr tablet Take 1 tab by mouth daily Patient not taking: Reported on 09/23/2023 03/29/21       Family History Family History  Problem Relation Age of Onset   Healthy Mother    Healthy Father    Social History Social History[1] Allergies   Shrimp (diagnostic)  Review of Systems Review of Systems  HENT:  Positive for ear pain.   Respiratory:  Positive for cough.    Pertinent findings revealed after performing a 14 point review of systems has been noted in the history of present illness.  Physical Exam Vital Signs BP 114/75   Pulse 93   Temp 98.1 F  (36.7 C) (Oral)   Resp (!) 22   SpO2 94%   No data found.  Physical Exam Vitals and nursing note reviewed.  Constitutional:      General: She is awake. She is not in acute distress.    Appearance: Normal appearance. She is well-developed and well-groomed. She is ill-appearing.  HENT:     Head: Normocephalic and atraumatic.     Salivary Glands: Right salivary gland is diffusely enlarged. Right salivary gland is not tender. Left salivary gland is diffusely enlarged. Left salivary gland is not tender.     Right Ear: Hearing, ear canal and external ear normal. No decreased hearing noted. No drainage. A middle ear effusion is present. Tympanic membrane is injected and erythematous. Tympanic membrane is not scarred, perforated or bulging.     Left Ear: Hearing, ear canal and external ear normal. No decreased hearing noted. No drainage. A middle ear effusion is present. Tympanic membrane is injected and erythematous. Tympanic membrane is not scarred, perforated or bulging.     Nose: Rhinorrhea present. No nasal deformity, septal deviation, signs of injury or nasal tenderness. Rhinorrhea is clear.     Right Nostril: Occlusion present. No foreign body, epistaxis or septal hematoma.     Left Nostril: Occlusion present. No foreign body, epistaxis or septal hematoma.     Right Turbinates: Enlarged and swollen. Not pale.     Left Turbinates: Enlarged and swollen. Not pale.  Right Sinus: No maxillary sinus tenderness or frontal sinus tenderness.     Left Sinus: No maxillary sinus tenderness or frontal sinus tenderness.     Mouth/Throat:     Lips: Pink. No lesions.     Mouth: Mucous membranes are moist. No oral lesions.     Tongue: No lesions. Tongue does not deviate from midline.     Palate: No mass and lesions.     Pharynx: Oropharynx is clear. Uvula midline. Posterior oropharyngeal erythema and postnasal drip present. No pharyngeal swelling, oropharyngeal exudate or uvula swelling.     Tonsils:  No tonsillar exudate. 0 on the right. 0 on the left.     Comments: Postnasal drip Eyes:     General: Lids are normal.        Right eye: No discharge.        Left eye: No discharge.     Conjunctiva/sclera: Conjunctivae normal.     Right eye: Right conjunctiva is not injected.     Left eye: Left conjunctiva is not injected.  Neck:     Trachea: Trachea and phonation normal.  Cardiovascular:     Rate and Rhythm: Normal rate and regular rhythm.  Pulmonary:     Effort: Pulmonary effort is normal. No tachypnea, accessory muscle usage, prolonged expiration or respiratory distress.     Breath sounds: Examination of the right-upper field reveals decreased breath sounds. Examination of the left-upper field reveals decreased breath sounds. Examination of the right-middle field reveals decreased breath sounds. Examination of the left-middle field reveals decreased breath sounds. Examination of the right-lower field reveals decreased breath sounds. Examination of the left-lower field reveals decreased breath sounds. Decreased breath sounds present.  Chest:     Chest wall: No tenderness.  Musculoskeletal:        General: Normal range of motion.     Cervical back: Full passive range of motion without pain, normal range of motion and neck supple. Normal range of motion.  Lymphadenopathy:     Head:     Right side of head: Submandibular and tonsillar adenopathy present.     Left side of head: Submandibular and tonsillar adenopathy present.     Cervical: Cervical adenopathy present.     Right cervical: Superficial cervical adenopathy present.     Left cervical: Superficial cervical adenopathy present.     Upper Body:     Right upper body: Supraclavicular adenopathy present.     Left upper body: Supraclavicular adenopathy present.  Skin:    General: Skin is warm and dry.     Findings: No erythema or rash.  Neurological:     General: No focal deficit present.     Mental Status: She is alert and  oriented to person, place, and time. Mental status is at baseline.  Psychiatric:        Attention and Perception: Attention and perception normal.        Mood and Affect: Mood and affect normal.        Speech: Speech normal.        Behavior: Behavior normal. Behavior is cooperative.        Thought Content: Thought content normal.     Visual Acuity Right Eye Distance:   Left Eye Distance:   Bilateral Distance:    Right Eye Near:   Left Eye Near:    Bilateral Near:     UC Couse / Diagnostics / Procedures:     Radiology No results found.  Procedures Procedures (including critical  care time) EKG  Pending results:  Labs Reviewed  POCT URINE PREGNANCY  POC COVID19/FLU A&B COMBO    Medications Ordered in UC: Medications  ipratropium-albuterol  (DUONEB) 0.5-2.5 (3) MG/3ML nebulizer solution 3 mL (3 mLs Nebulization Given 01/25/24 1126)    UC Diagnoses / Final Clinical Impressions(s)   I have reviewed the triage vital signs and the nursing notes.  Pertinent labs & imaging results that were available during my care of the patient were reviewed by me and considered in my medical decision making (see chart for details).    Final diagnoses:  Feeling unwell  Acute suppurative otitis media of both ears without spontaneous rupture of tympanic membranes, recurrence not specified  Irregular periods  Mild intermittent asthma with (acute) exacerbation   Patient reported meaningful improvement of work of breathing after nebulized bronchodilator treatment.  Repeat auscultation did not reveal wheeze, rales or rhonchi.  Physical exam findings concerning for bilateral otitis media, will treat empirically with a 10-day course of Augmentin .  UPT today was negative, pt notified.  Please see discharge instructions below for details of plan of care as provided to patient. ED Prescriptions     Medication Sig Dispense Auth. Provider   amoxicillin -clavulanate (AUGMENTIN ) 875-125 MG tablet  Take 1 tablet by mouth 2 (two) times daily for 10 days. 20 tablet Joesph Shaver Scales, PA-C   albuterol  (VENTOLIN  HFA) 108 (90 Base) MCG/ACT inhaler Inhale 2 puffs into the lungs every 6 (six) hours as needed for wheezing or shortness of breath (Cough). 18 g Joesph Shaver Scales, PA-C   Spacer/Aero-Holding Chambers (AEROCHAMBER PLUS FLO-VU MEDIUM) MISC 1 each by Other route once for 1 dose. 1 each Joesph Shaver Scales, PA-C      PDMP not reviewed this encounter.    Discharge Instructions      Lea a continuacin para obtener ms informacin [employer], las dosis y print production planner que recomiendo para paramedic sus sntomas y que se sienta mejor pronto:  Augmentin  (amoxicilina - cido clavulnico): Tome una (1) dosis toys 'r' us al da durante [address] Este antibitico puede causar malestar estomacal, que desaparecer una vez finalizado el [place]. Puede tomar un probitico, comer yogur o tomar Imodium mientras toma este medicamento. Evite otros medicamentos sistmicos como Maalox, Pepto-Bismol o leche de magnesia, ya que pueden interferir con la absorcin de los antibiticos.  ProAir , Ventolin , Proventil  (albuterol ): Este medicamento inhalado contiene un broncodilatador agonista beta de accin corta. Este medicamento relaja el msculo liso de las vas respiratorias en los pulmones. Cuando estos msculos estn tensos, la respiracin se dificulta. La relajacin del msculo liso permite un mayor flujo de aire y mejora la respiracin. Este es un medicamento de accin corta que se puede usar cada 4 a 6 horas segn sea necesario para la dificultad para respirar, la falta de aire, las sibilancias y la tos [place]. Viene en forma de inhalador de mano o solucin para nebulizador. Recomiendo que [employer] prximos 3 a 4 [place], use este medicamento 4 veces al da de forma programada, luego disminuya a toys 'r' us al da y segn sea necesario hasta que los sntomas hayan desaparecido por  completo, lo que anticipo que ser en varias semanas.  Si los sntomas no han mejorado significativamente en los prximos 5 a [address], regrese para una nueva evaluacin o consulte con su mdico habitual. Si los sntomas han [employer] prximos 3 a 5 [place], acuda a la sala de emergencias para una evaluacin adicional.  Gracias por visitar la [employer]  de atencin de urgencia hoy. Agradecemos la oportunidad de participar en su atencin.   Please read below to learn more about the medications, dosages and frequencies that I recommend to help alleviate your symptoms and to get you feeling better soon:   Augmentin  (amoxicillin  - clavulanic acid):  Please take one (1) dose twice daily for 10 days.This antibiotic can cause upset stomach, this will resolve once antibiotics are complete.  You are welcome to take a probiotic, eat yogurt, take Imodium while taking this medication.  Please avoid other systemic medications such as Maalox, Pepto-Bismol or milk of magnesia as they can interfere with the body's ability to absorb the antibiotics.                ProAir , Ventolin , Proventil  (albuterol ): This inhaled medication contains a short acting beta agonist bronchodilator.  This medication relaxes the smooth muscle of the airway in the lungs.  When these muscles are tight, breathing becomes more constricted.  The result of relaxation of the smooth muscle is increased air movement and improved work of breathing.  This is a short acting medication that can be used every 4-6 hours as needed for increased work of breathing, shortness of breath, wheezing and excessive coughing.  It comes in the form of a handheld inhaler or nebulizer solution.  I recommended that for the next 3 to 4 days, this medication is used 4 times daily on a scheduled basis then decrease to twice daily and as needed until symptoms have completely resolved which I anticipate will be several weeks.   If symptoms have not meaningfully improved in  the next 5 to 7 days, please return for repeat evaluation or follow-up with your regular provider.  If symptoms have worsened in the next 3 to 5 days, please go to the emergency room for further evaluation.    Thank you for visiting urgent care today.  We appreciate the opportunity to participate in your care.       Disposition Upon Discharge:  Condition: stable for discharge home  Patient presented with an acute illness with associated systemic symptoms and significant discomfort requiring urgent management. In my opinion, this is a condition that a prudent lay person (someone who possesses an average knowledge of health and medicine) may potentially expect to result in complications if not addressed urgently such as respiratory distress, impairment of bodily function or dysfunction of bodily organs.   Routine symptom specific, illness specific and/or disease specific instructions were discussed with the patient and/or caregiver at length.   As such, the patient has been evaluated and assessed, work-up was performed and treatment was provided in alignment with urgent care protocols and evidence based medicine.  Patient/parent/caregiver has been advised that the patient may require follow up for further testing and treatment if the symptoms continue in spite of treatment, as clinically indicated and appropriate.  Patient/parent/caregiver has been advised to return to the [facility] or PCP if no better; to PCP or the Emergency Department if new signs and symptoms develop, or if the current signs or symptoms continue to change or worsen for further workup, evaluation and treatment as clinically indicated and appropriate  The patient will follow up with their current PCP if and as advised. If the patient does not currently have a PCP we will assist them in obtaining one.   The patient may need specialty follow up if the symptoms continue, in spite of conservative treatment and management, for further  workup, evaluation, consultation and treatment as  clinically indicated and appropriate.  Patient/parent/caregiver verbalized understanding and agreement of plan as discussed.  All questions were addressed during visit.  Please see discharge instructions below for further details of plan.  This office note has been dictated using Teaching laboratory technician.  Unfortunately, this method of dictation can sometimes lead to typographical or grammatical errors.  I apologize for your inconvenience in advance if this occurs.  Please do not hesitate to reach out to me if clarification is needed.       [1]  Social History Tobacco Use   Smoking status: Never   Smokeless tobacco: Never  Vaping Use   Vaping status: Never Used  Substance Use Topics   Alcohol use: Not Currently   Drug use: Never     Joesph Shaver Scales, PA-C 01/25/24 1141

## 2024-01-25 NOTE — Discharge Instructions (Signed)
 Lea a continuacin para obtener ms informacin [employer], las dosis y print production planner que recomiendo para paramedic sus sntomas y que se sienta mejor pronto:  Augmentin  (amoxicilina - cido clavulnico): Tome una (1) dosis toys 'r' us al da durante [address]. Este antibitico puede causar malestar estomacal, que desaparecer una vez finalizado el [place]. Puede tomar un probitico, comer yogur o tomar Imodium mientras toma este medicamento. Evite otros medicamentos sistmicos como Maalox, Pepto-Bismol o leche de magnesia, ya que pueden interferir con la absorcin de los antibiticos.  ProAir , Ventolin , Proventil  (albuterol ): Este medicamento inhalado contiene un broncodilatador agonista beta de accin corta. Este medicamento relaja el msculo liso de las vas respiratorias en los pulmones. Cuando estos msculos estn tensos, la respiracin se dificulta. La relajacin del msculo liso permite un mayor flujo de aire y mejora la respiracin. Este es un medicamento de accin corta que se puede usar cada 4 a 6 horas segn sea necesario para la dificultad para respirar, la falta de aire, las sibilancias y la tos [place]. Viene en forma de inhalador de mano o solucin para nebulizador. Recomiendo que [employer] prximos 3 a 4 [place], use este medicamento 4 veces al da de forma programada, luego disminuya a toys 'r' us al da y segn sea necesario hasta que los sntomas hayan desaparecido por completo, lo que anticipo que ser en varias semanas.  Si los sntomas no han mejorado significativamente en los prximos 5 a [address], regrese para una nueva evaluacin o consulte con su mdico habitual. Si los sntomas han [employer] prximos 3 a 5 [place], acuda a la sala de emergencias para una evaluacin adicional.  Gracias por visitar la clnica de atencin de urgencia hoy. Agradecemos la oportunidad de participar en su atencin.   Please read below to learn more about the medications, dosages and  frequencies that I recommend to help alleviate your symptoms and to get you feeling better soon:   Augmentin  (amoxicillin  - clavulanic acid):  Please take one (1) dose twice daily for 10 days.This antibiotic can cause upset stomach, this will resolve once antibiotics are complete.  You are welcome to take a probiotic, eat yogurt, take Imodium while taking this medication.  Please avoid other systemic medications such as Maalox, Pepto-Bismol or milk of magnesia as they can interfere with the body's ability to absorb the antibiotics.                ProAir , Ventolin , Proventil  (albuterol ): This inhaled medication contains a short acting beta agonist bronchodilator.  This medication relaxes the smooth muscle of the airway in the lungs.  When these muscles are tight, breathing becomes more constricted.  The result of relaxation of the smooth muscle is increased air movement and improved work of breathing.  This is a short acting medication that can be used every 4-6 hours as needed for increased work of breathing, shortness of breath, wheezing and excessive coughing.  It comes in the form of a handheld inhaler or nebulizer solution.  I recommended that for the next 3 to 4 days, this medication is used 4 times daily on a scheduled basis then decrease to twice daily and as needed until symptoms have completely resolved which I anticipate will be several weeks.   If symptoms have not meaningfully improved in the next 5 to 7 days, please return for repeat evaluation or follow-up with your regular provider.  If symptoms have worsened in the next 3 to 5 days, please go to the emergency  room for further evaluation.    Thank you for visiting urgent care today.  We appreciate the opportunity to participate in your care.

## 2024-01-25 NOTE — ED Triage Notes (Addendum)
 Triage performed via [employer] video interpreter [phone]: C/O cough, sore throat, and sensation of clogged right ear and right ear pain onset 5 days ago. No known fevers. Has been taking Robitussin.

## 2024-01-25 NOTE — ED Notes (Signed)
 Discharge instructions reviewed via [employer] video interpreter [phone].

## 2024-01-26 NOTE — Progress Notes (Signed)
 [facility]  Haliey Keelyn Monjaras, is a 28 y.o. female  RDW:[id number]  FMW:[id number]  DOB - [birth year]  Chief Complaint  Patient presents with   Sore Throat    Strated 2 days no medicine        Subjective:   Keyon Tezoquipa Castro is a 28 y.o. female here today for an acute visit.C/O sore throat, nasal congestion and cough,  Sore Throat     No problems updated.  Comprehensive ROS Pertinent positive and negative noted in HPI   Allergies[1]  No past medical history on file.  Medications Ordered Prior to Encounter[2] Health Maintenance  Topic Date Due   Pneumococcal Vaccine (1 of 2 - PCV) Never done   Hepatitis B Vaccine (1 of 3 - 19+ 3-dose series) Never done   HPV Vaccine (1 - 3-dose SCDM series) Never done   Flu Shot  Never done   COVID-19 Vaccine (1 - 2025-26 season) Never done   Pap Smear  06/11/2025   DTaP/Tdap/Td vaccine (2 - Td or Tdap) 06/11/2033   Hepatitis C Screening  Completed   HIV Screening  Completed   Meningitis B Vaccine  Aged Out    Objective:   Vitals:   01/20/24 1033  BP: 116/77  Pulse: (!) 104  Resp: 16  SpO2: 97%   BP Readings from Last 3 Encounters:  01/25/24 114/75  01/20/24 116/77  09/23/23 103/70      Physical Exam Vitals reviewed.  Constitutional:      Appearance: Normal appearance. She is obese.  HENT:     Head: Normocephalic.     Right Ear: Tympanic membrane, ear canal and external ear normal.     Left Ear: Tympanic membrane, ear canal and external ear normal.     Nose: Congestion present.     Comments: Left > right turbinate swollen red    Mouth/Throat:     Mouth: Mucous membranes are moist.     Pharynx: Pharyngeal swelling and posterior oropharyngeal erythema present.  Eyes:     Extraocular Movements: Extraocular movements intact.     Pupils: Pupils are equal, round, and reactive to light.  Cardiovascular:     Rate and Rhythm: Normal rate and regular rhythm.  Pulmonary:     Effort:  Pulmonary effort is normal.     Breath sounds: Normal breath sounds.  Abdominal:     General: Bowel sounds are normal.     Palpations: Abdomen is soft.  Musculoskeletal:        General: Normal range of motion.     Cervical back: Normal range of motion and neck supple.  Skin:    General: Skin is warm and dry.  Neurological:     Mental Status: She is alert and oriented to person, place, and time.  Psychiatric:        Mood and Affect: Mood normal.        Behavior: Behavior normal.        Thought Content: Thought content normal.     Assessment & Plan  Raynah was seen today for sore throat.  Diagnoses and all orders for this visit:  Acute cough 2/2 Nasal congestion OTC normal saline or Flonase  , antihistamine , cough medication read labels do not duplicate medication  Seasonal allergies OTC normal saline or Flonase  , antihistamine      Patient have been counseled extensively about nutrition and exercise. Other issues discussed during this visit include: low cholesterol diet, weight control and daily exercise, foot care, annual  eye examinations at Ophthalmology, importance of adherence with medications and regular follow-up. We also discussed long term complications of uncontrolled diabetes and hypertension.   No follow-ups on file.  The patient was given clear instructions to go to ER or return to medical center if symptoms don't improve, worsen or new problems develop. The patient verbalized understanding. The patient was told to call to get lab results if they haven't heard anything in the next week.   This note has been created with Education officer, environmental. Any transcriptional errors are unintentional.   [name], NP 01/26/2024, 9:01 PM    [1]  Allergies Allergen Reactions   Shrimp (Diagnostic) Hives  [2]  No current outpatient medications on file prior to visit.   No current facility-administered medications on file  prior to visit.

## 2024-04-20 ENCOUNTER — Ambulatory Visit (INDEPENDENT_AMBULATORY_CARE_PROVIDER_SITE_OTHER): Payer: Self-pay | Admitting: Primary Care
